# Patient Record
Sex: Female | Born: 1935 | Race: White | Hispanic: No | Marital: Single | Smoking: Never smoker
Health system: Southern US, Community
[De-identification: ages and names within clinical notes are randomized; demographics above are authoritative.]

## PROBLEM LIST (undated history)

## (undated) DIAGNOSIS — E039 Hypothyroidism, unspecified: Secondary | ICD-10-CM

## (undated) DIAGNOSIS — F0390 Unspecified dementia without behavioral disturbance: Secondary | ICD-10-CM

---

## 2011-05-15 ENCOUNTER — Emergency Department: Payer: Self-pay | Admitting: Emergency Medicine

## 2011-05-15 LAB — CBC
HCT: 38.6 % (ref 35.0–47.0)
HGB: 12.8 g/dL (ref 12.0–16.0)
MCH: 31 pg (ref 26.0–34.0)
MCHC: 33.2 g/dL (ref 32.0–36.0)
MCV: 94 fL (ref 80–100)
Platelet: 267 10*3/uL (ref 150–440)
RBC: 4.13 10*6/uL (ref 3.80–5.20)
RDW: 13.7 % (ref 11.5–14.5)
WBC: 14.1 10*3/uL — ABNORMAL HIGH (ref 3.6–11.0)

## 2011-05-15 LAB — COMPREHENSIVE METABOLIC PANEL
Albumin: 4.4 g/dL (ref 3.4–5.0)
Chloride: 105 mmol/L (ref 98–107)
Potassium: 4.7 mmol/L (ref 3.5–5.1)
SGOT(AST): 35 U/L (ref 15–37)
SGPT (ALT): 27 U/L
Total Protein: 8.4 g/dL — ABNORMAL HIGH (ref 6.4–8.2)

## 2011-08-10 ENCOUNTER — Emergency Department: Payer: Self-pay | Admitting: Emergency Medicine

## 2012-07-04 ENCOUNTER — Emergency Department: Payer: Self-pay | Admitting: Emergency Medicine

## 2012-07-04 LAB — COMPREHENSIVE METABOLIC PANEL
Albumin: 3.6 g/dL (ref 3.4–5.0)
BUN: 17 mg/dL (ref 7–18)
Bilirubin,Total: 0.2 mg/dL (ref 0.2–1.0)
Chloride: 108 mmol/L — ABNORMAL HIGH (ref 98–107)
Co2: 28 mmol/L (ref 21–32)
Creatinine: 0.9 mg/dL (ref 0.60–1.30)
EGFR (African American): >60
EGFR (Non-African Amer.): 56 — ABNORMAL LOW
Glucose: 95 mg/dL (ref 65–99)
Osmolality: 286 (ref 275–301)
SGOT(AST): 29 U/L (ref 15–37)
Sodium: 143 mmol/L (ref 136–145)

## 2012-07-04 LAB — CBC
HCT: 38.8 % (ref 35.0–47.0)
Platelet: 234 10*3/uL (ref 150–440)
RBC: 4.21 10*6/uL (ref 3.80–5.20)
RDW: 12.6 % (ref 11.5–14.5)

## 2012-07-04 LAB — URINALYSIS, COMPLETE
Glucose,UR: NEGATIVE mg/dL (ref 0–75)
Ketone: NEGATIVE
Nitrite: NEGATIVE
RBC,UR: 3 /HPF (ref 0–5)
Specific Gravity: 1.009 (ref 1.003–1.030)
Squamous Epithelial: 2

## 2012-07-04 LAB — TROPONIN I: Troponin-I: <0.02 ng/mL

## 2012-07-05 ENCOUNTER — Emergency Department: Payer: Self-pay | Admitting: Unknown Physician Specialty

## 2012-07-09 LAB — CULTURE, BLOOD (SINGLE)

## 2012-09-14 LAB — TROPONIN I: Troponin-I: <0.02 ng/mL

## 2012-09-14 LAB — CBC
HCT: 36.1 % (ref 35.0–47.0)
HGB: 12.3 g/dL (ref 12.0–16.0)
MCH: 31.1 pg (ref 26.0–34.0)
Platelet: 254 10*3/uL (ref 150–440)
RBC: 3.94 10*6/uL (ref 3.80–5.20)
WBC: 11.9 10*3/uL — ABNORMAL HIGH (ref 3.6–11.0)

## 2012-09-14 LAB — URINALYSIS, COMPLETE
Bacteria: NONE SEEN
Bilirubin,UR: NEGATIVE
Blood: NEGATIVE
Glucose,UR: NEGATIVE mg/dL (ref 0–75)
Ketone: NEGATIVE
Leukocyte Esterase: NEGATIVE
Nitrite: NEGATIVE
Ph: 7 (ref 4.5–8.0)
Protein: NEGATIVE
RBC,UR: 3 /HPF (ref 0–5)
Specific Gravity: 1.017 (ref 1.003–1.030)
Squamous Epithelial: NONE SEEN
WBC UR: 1 /HPF (ref 0–5)

## 2012-09-14 LAB — BASIC METABOLIC PANEL
BUN: 18 mg/dL (ref 7–18)
Calcium, Total: 9.2 mg/dL (ref 8.5–10.1)
Chloride: 105 mmol/L (ref 98–107)
Co2: 30 mmol/L (ref 21–32)
Creatinine: 1.04 mg/dL (ref 0.60–1.30)
Glucose: 127 mg/dL — ABNORMAL HIGH (ref 65–99)
Potassium: 3.8 mmol/L (ref 3.5–5.1)

## 2012-09-14 LAB — CK TOTAL AND CKMB (NOT AT ARMC): CK, Total: 81 U/L (ref 21–215)

## 2012-09-15 ENCOUNTER — Inpatient Hospital Stay: Payer: Self-pay | Admitting: Internal Medicine

## 2012-09-15 LAB — BASIC METABOLIC PANEL
Anion Gap: 5 — ABNORMAL LOW (ref 7–16)
BUN: 15 mg/dL (ref 7–18)
Calcium, Total: 8.7 mg/dL (ref 8.5–10.1)
Co2: 28 mmol/L (ref 21–32)
Creatinine: 0.94 mg/dL (ref 0.60–1.30)
EGFR (African American): >60
Glucose: 88 mg/dL (ref 65–99)
Osmolality: 280 (ref 275–301)
Potassium: 3.9 mmol/L (ref 3.5–5.1)
Sodium: 140 mmol/L (ref 136–145)

## 2012-09-15 LAB — CBC WITH DIFFERENTIAL/PLATELET
Basophil #: 0 10*3/uL (ref 0.0–0.1)
Basophil %: 0.5 %
Eosinophil #: 0.2 10*3/uL (ref 0.0–0.7)
HGB: 11.6 g/dL — ABNORMAL LOW (ref 12.0–16.0)
Lymphocyte %: 21.4 %
MCH: 31.5 pg (ref 26.0–34.0)
MCHC: 34.2 g/dL (ref 32.0–36.0)
MCV: 92 fL (ref 80–100)
Monocyte #: 0.9 x10 3/mm (ref 0.2–0.9)
Neutrophil #: 5.2 10*3/uL (ref 1.4–6.5)
Neutrophil %: 64.8 %
RDW: 13.2 % (ref 11.5–14.5)
WBC: 8 10*3/uL (ref 3.6–11.0)

## 2012-09-15 LAB — TROPONIN I
Troponin-I: <0.02 ng/mL
Troponin-I: <0.02 ng/mL

## 2012-09-19 LAB — CULTURE, BLOOD (SINGLE)

## 2012-12-07 ENCOUNTER — Emergency Department: Payer: Self-pay | Admitting: Emergency Medicine

## 2012-12-07 LAB — URINALYSIS, COMPLETE
Ketone: NEGATIVE
Nitrite: NEGATIVE
Ph: 5 (ref 4.5–8.0)
Protein: NEGATIVE
RBC,UR: 1 /HPF (ref 0–5)
Specific Gravity: 1.02 (ref 1.003–1.030)

## 2012-12-07 LAB — CBC WITH DIFFERENTIAL/PLATELET
Basophil #: 0.1 10*3/uL (ref 0.0–0.1)
HCT: 38.6 % (ref 35.0–47.0)
HGB: 13.2 g/dL (ref 12.0–16.0)
Lymphocyte #: 1.5 10*3/uL (ref 1.0–3.6)
MCH: 31.2 pg (ref 26.0–34.0)
MCHC: 34.2 g/dL (ref 32.0–36.0)
Platelet: 221 10*3/uL (ref 150–440)
RBC: 4.23 10*6/uL (ref 3.80–5.20)
WBC: 10.1 10*3/uL (ref 3.6–11.0)

## 2012-12-07 LAB — BASIC METABOLIC PANEL
Anion Gap: 6 — ABNORMAL LOW (ref 7–16)
BUN: 20 mg/dL — ABNORMAL HIGH (ref 7–18)
Co2: 26 mmol/L (ref 21–32)
EGFR (African American): 51 — ABNORMAL LOW
EGFR (Non-African Amer.): 44 — ABNORMAL LOW

## 2013-01-20 ENCOUNTER — Emergency Department: Payer: Self-pay | Admitting: Emergency Medicine

## 2013-01-20 LAB — URINALYSIS, COMPLETE
Bacteria: NONE SEEN
Blood: NEGATIVE
Glucose,UR: NEGATIVE mg/dL (ref 0–75)
Hyaline Cast: 2
Specific Gravity: 1.02 (ref 1.003–1.030)
Squamous Epithelial: 1
WBC UR: 1 /HPF (ref 0–5)

## 2013-10-07 ENCOUNTER — Emergency Department: Payer: Self-pay | Admitting: Emergency Medicine

## 2013-10-07 LAB — CBC
HCT: 39.4 % (ref 35.0–47.0)
HGB: 12.7 g/dL (ref 12.0–16.0)
MCH: 30.7 pg (ref 26.0–34.0)
MCHC: 32.3 g/dL (ref 32.0–36.0)
MCV: 95 fL (ref 80–100)
Platelet: 302 10*3/uL (ref 150–440)
RBC: 4.14 10*6/uL (ref 3.80–5.20)
RDW: 12.9 % (ref 11.5–14.5)
WBC: 9.2 10*3/uL (ref 3.6–11.0)

## 2013-10-07 LAB — COMPREHENSIVE METABOLIC PANEL
ALK PHOS: 117 U/L — AB
ANION GAP: 8 (ref 7–16)
AST: 30 U/L (ref 15–37)
Albumin: 3.6 g/dL (ref 3.4–5.0)
BUN: 25 mg/dL — AB (ref 7–18)
Bilirubin,Total: 0.2 mg/dL (ref 0.2–1.0)
CO2: 26 mmol/L (ref 21–32)
Calcium, Total: 9 mg/dL (ref 8.5–10.1)
Chloride: 108 mmol/L — ABNORMAL HIGH (ref 98–107)
Creatinine: 0.87 mg/dL (ref 0.60–1.30)
EGFR (Non-African Amer.): 58 — ABNORMAL LOW
GLUCOSE: 100 mg/dL — AB (ref 65–99)
Osmolality: 288 (ref 275–301)
Potassium: 4 mmol/L (ref 3.5–5.1)
SGPT (ALT): 17 U/L
SODIUM: 142 mmol/L (ref 136–145)
Total Protein: 7.9 g/dL (ref 6.4–8.2)

## 2013-10-07 LAB — URINALYSIS, COMPLETE
Bacteria: NONE SEEN
Bilirubin,UR: NEGATIVE
Blood: NEGATIVE
Glucose,UR: NEGATIVE mg/dL (ref 0–75)
KETONE: NEGATIVE
LEUKOCYTE ESTERASE: NEGATIVE
NITRITE: NEGATIVE
Ph: 5 (ref 4.5–8.0)
Protein: NEGATIVE
RBC,UR: 2 /HPF (ref 0–5)
SPECIFIC GRAVITY: 1.021 (ref 1.003–1.030)
Squamous Epithelial: 1

## 2013-10-07 LAB — TROPONIN I

## 2013-11-09 ENCOUNTER — Emergency Department: Payer: Self-pay | Admitting: Emergency Medicine

## 2013-11-21 ENCOUNTER — Emergency Department: Payer: Self-pay | Admitting: Emergency Medicine

## 2014-04-15 ENCOUNTER — Emergency Department: Payer: Self-pay | Admitting: Emergency Medicine

## 2014-05-19 ENCOUNTER — Ambulatory Visit
Admit: 2014-05-19 | Disposition: A | Discharge status: Home / Self Care | Payer: Self-pay | Attending: Internal Medicine | Admitting: Internal Medicine

## 2014-06-09 NOTE — H&P (Signed)
Powers NAME:  Amber Powers, Amber Powers MR#:  [id number] DATE OF BIRTH:  17-Dec-1919  DATE OF ADMISSION:  09/14/2012  PRIMARY CARE PHYSICIAN: Dr. Dorothey Baseman.   REFERRING PHYSICIAN: Simonne Maffucci, PA-C.   CHIEF COMPLAINT: Altered mental status, cough.   HISTORY OF PRESENT ILLNESS: Amber Powers is a 81 year old white female with history of hypertension and hyperlipidemia, who lives in assisted living facility, and advanced dementia and hypothyroidism, is brought to [facility] Emergency Department for altered mental status and cough. Per daughter, who is at bedside, states that this evening Amber Powers also complained of some chest pain. Having cough. It is a wet cough. Did not have any fever. Concerning this, Amber Powers was sent to [facility]. Initially, Amber Powers was hypoxic with oxygen saturations of 88%. Amber Powers was placed on 2 liters of oxygen, with improvement of Amber oxygen saturations to 96%. Chest x-ray shows patchy infiltrates. Amber Powers is also quite somnolent. Unable to obtain any history from Amber Powers. Responds slightly with painful stimuli. Per daughter, Amber Powers usually walks with Amber help of a cane, eats independently but slowly, recognizes very limited family members who visit her frequently. Amber Powers received Levaquin in [facility] Emergency Department.   PAST MEDICAL HISTORY:  1. Knee pain.  2. Weight loss.  3. Osteopenia.  4. Hypothyroidism.  5. Hyperlipidemia.  6. Hypertension.  7. Dementia.   ALLERGIES: No known drug allergies.   HOME MEDICATIONS:  1. Levothyroxine 50 mcg once a day.  2. Doxepin 10 mg 2 capsules once a day.  3. Docusate sodium 100 mg 2 times a day.  4. Alprazolam 0.25 mg every 6 hours as needed.  5.  0.25 mg every 12 hours.  6. Vicodin 1 tablet 3 times a day.   SOCIAL HISTORY: No history of smoking, drinking alcohol or using illicit drugs. Lives in an assisted living facility.   FAMILY HISTORY: Noncontributory; however, could not be  obtained from Amber Powers.   REVIEW OF SYSTEMS: Could not be obtained secondary to altered mental status as well as dementia.   PHYSICAL EXAMINATION:  GENERAL: This is a well-built, well-nourished, age-appropriate female lying down in Amber bed, not in distress.  VITAL SIGNS: Temperature 98.2, pulse 80, blood pressure 119/53, respiratory rate of 16, oxygen saturations 98% on 2 liters of oxygen.  HEENT: Head normocephalic, atraumatic. There is no scleral icterus. Conjunctivae normal. Pupils equal and react to light. Mucous membranes moist. Could not examine Amber oropharynx.  NECK: Supple. No lymphadenopathy. No JVD. No carotid bruit. No thyromegaly.  CHEST: Has no focal tenderness. Bilateral rhonchi are heard.  HEART: S1, S2, regular, tachycardia. No pedal edema. Pulses 2+.  ABDOMEN: Bowel sounds present. Soft, nontender, nondistended. No hepatosplenomegaly.  SKIN: No rash or lesions.  MUSCULOSKELETAL: Good range of motion in all of Amber extremities.  NEUROLOGIC: Amber Powers is not oriented to place, person and time. Quite somnolent. Could not examine Amber motor and sensory.   LABS: CBC: WBC of 11.9, hemoglobin 12.3, platelet count of 254. BMP is completely within normal limits.   Troponin less than 0.02, CK 81, CK-MB of 0.5.   TSH 1.97.   UA negative for nitrites and leukocyte esterase.   CT HEAD WITHOUT CONTRAST: Chronic involutional changes, without evidence of acute abnormalities.   CT OF Amber CHEST: No evidence of PE. Moderate hiatal hernia. Interstitial changes within Amber lungs which may represent a component of pulmonary edema or atelectasis.   ASSESSMENT AND PLAN: Amber Powers is a 81 year old female  brought to [facility] for altered mental status and is found to have pneumonia.  1. Altered mental status: This seems to be a combination from Amber medication with alprazolam and Vicodin as well as pneumonia. Admit Amber Powers to Amber monitored bed concerning Amber Powers  complained of chest pain. Hold all sedating medications. Treat Amber underlying pneumonia.  2. Pneumonia: Aspiration versus community-acquired pneumonia. Will treat it with Rocephin and Zithromax anyway. Keep Amber Powers n.p.o. Per daughter, Amber Powers usually does not have any cough with swallowing.  3. Hypertension: Currently well controlled. Will hold all medications for now.  4. Debility: Will involve physical therapy, occupational therapy.  5. Keep Amber Powers on deep vein thrombosis prophylaxis with Lovenox.   TIME SPENT: 45 minutes.   ____________________________ Amber GriffinsPadmaja Calani Gick, MD pv:gb D: 09/14/2012 23:58:55 ET T: 09/15/2012 00:11:54 ET JOB#: [id number]  cc: Amber GriffinsPadmaja Mata Rowen, MD, <Dictator> Teena Iraniavid M. Terance HartBronstein, MD Amber GriffinsPADMAJA Kaelum Kissick MD ELECTRONICALLY SIGNED 10/20/2012 21:45

## 2014-06-09 NOTE — Discharge Summary (Signed)
PATIENT NAME:  [name], Emrey C MR#:  [id number] DATE OF BIRTH:  July 23, 1919  DATE OF ADMISSION:  09/15/2012 DATE OF DISCHARGE:  09/16/2012  DISCHARGE DIAGNOSES: 1.  Altered mental status, pneumonia, possible aspiration.  2.  Hypertension.  3.  Hyponatremia.  4.  Dementia.   CONDITION ON DISCHARGE:  Stable.   CODE STATUS:  FULL CODE.  MEDICATIONS ON DISCHARGE:   1.  Levothyroxine 50 mcg once a day.  2.  Vitamin B12 at 1000 mcg once a day. 3.  Vitamin D3 at 1000 international units once a day.  4.  Lovastatin 40 mg once a day.  5.  Mirtazapine 15 mg once a day.  6.  Docusate 100 mg 2 times a day.  7.  Alprazolam 0.25 mg oral tablet every 6 hours as needed for anxiety.  8.  Amlodipine 5 mg once a day.  9.  Ceftin 500 mg oral tablet 2 times a day for 4 days.   DIET:  The supplement Ensure.  Diet consistency, mechanical soft, TIMEFRAME TO FOLLOWUP:  Within 1 to 2 weeks, regular appointment with PMD.   HISTORY OF PRESENT ILLNESS:  A 81 year old female with history of hypertension, hyperlipidemia, lives at assisted-living facility and has advanced dementia and hypothyroidism, brought to Emergency Room for altered mental status.  As per daughter, she was complaining of some chest pain and was having cough, did not have any fever, and so she was brought to the Emergency Room. Oxygen saturation was 88%.  She was placed on 2 liters of oxygen supplementation, and oxygen saturation came to 96.  Chest x-ray showed patchy infiltrate and so she was started on treatment of pneumonia.  The patient was also taking alprazolam and Vicodin; we stopped that.  Possible altered mental status was due to combination of pneumonia and this medication.  The patient had significant improvement in her mental status the next day and as per daughter she was in her baseline.  Antibiotics Rocephin and Zithromax were started and continued.  She was initially kept nothing by mouth for possibility of aspiration pneumonia and  speech and swallow evaluation was done.  They suggested dietary recommendation and we can discharge the patient having that type of  instruction to be followed at assisted-living facility. 2.  Hypertension:  Started on amlodipine.  3.  Hyperlipidemia:  Continued on lovastatin.  4.  Hypothyroidism:  Continued levothyroxine.   IMPORTANT LABORATORY RESULTS:  White cell count on presentation 11.9.  Hemoglobin was 12.3.  BUN was 18, creatinine was 1.04, potassium was 3.8.  Chloride was 105.  TSH was 1.97.  Chest x-ray showed bony degenerative changes, linear atelectasis both lung bases, small to moderate-sized hiatal hernia, atherosclerotic disease.  Urinalysis was negative.  CT of the chest for pulmonary embolism; no CT evidence of pulmonary embolism, moderate-sized hiatal hernia and interstitial changes within the lung which may be present, component of pulmonary edema, mild atelectasis versus mild infiltrate.  Blood cultures negative.   Total time spent in this discharge 45 minutes.    ____________________________ Hope PigeonVaibhavkumar G. Elisabeth PigeonVachhani, MD vgv:ea D: 09/19/2012 23:06:52 ET T: 09/20/2012 00:00:18 ET JOB#: [id number]  cc: Hope PigeonVaibhavkumar G. Elisabeth PigeonVachhani, MD, <Dictator> Altamese DillingVAIBHAVKUMAR Melford Tullier MD ELECTRONICALLY SIGNED 09/30/2012 1:31

## 2014-06-15 ENCOUNTER — Inpatient Hospital Stay
Admission: AD | Admit: 2014-06-15 | Discharge: 2014-06-18 | DRG: 316 | Disposition: A | Discharge status: Nursing Home (Includes ICF) | Payer: Medicare Other | Attending: Internal Medicine | Admitting: Internal Medicine

## 2014-06-15 DIAGNOSIS — M1990 Unspecified osteoarthritis, unspecified site: Secondary | ICD-10-CM | POA: Diagnosis present

## 2014-06-15 DIAGNOSIS — R4182 Altered mental status, unspecified: Secondary | ICD-10-CM | POA: Diagnosis not present

## 2014-06-15 DIAGNOSIS — F0390 Unspecified dementia without behavioral disturbance: Secondary | ICD-10-CM | POA: Diagnosis present

## 2014-06-15 DIAGNOSIS — Z79899 Other long term (current) drug therapy: Secondary | ICD-10-CM

## 2014-06-15 DIAGNOSIS — I10 Essential (primary) hypertension: Secondary | ICD-10-CM | POA: Diagnosis present

## 2014-06-15 DIAGNOSIS — R627 Adult failure to thrive: Secondary | ICD-10-CM | POA: Diagnosis present

## 2014-06-15 DIAGNOSIS — D72829 Elevated white blood cell count, unspecified: Secondary | ICD-10-CM | POA: Diagnosis present

## 2014-06-15 DIAGNOSIS — Z9181 History of falling: Secondary | ICD-10-CM

## 2014-06-15 DIAGNOSIS — I959 Hypotension, unspecified: Principal | ICD-10-CM | POA: Diagnosis present

## 2014-06-15 DIAGNOSIS — E785 Hyperlipidemia, unspecified: Secondary | ICD-10-CM | POA: Diagnosis present

## 2014-06-15 DIAGNOSIS — E039 Hypothyroidism, unspecified: Secondary | ICD-10-CM | POA: Diagnosis present

## 2014-06-15 DIAGNOSIS — Z79891 Long term (current) use of opiate analgesic: Secondary | ICD-10-CM | POA: Diagnosis not present

## 2014-06-15 DIAGNOSIS — Z791 Long term (current) use of non-steroidal anti-inflammatories (NSAID): Secondary | ICD-10-CM

## 2014-06-15 LAB — COMPREHENSIVE METABOLIC PANEL
ALBUMIN: 4.1 g/dL
ALK PHOS: 89 U/L
ALT: 13 U/L — AB
AST: 33 U/L
Anion Gap: 11 (ref 7–16)
BUN: 20 mg/dL
Bilirubin,Total: 0.3 mg/dL
CHLORIDE: 106 mmol/L
Calcium, Total: 9.3 mg/dL
Co2: 22 mmol/L
Creatinine: 1.32 mg/dL — ABNORMAL HIGH
EGFR (African American): 40 — ABNORMAL LOW
EGFR (Non-African Amer.): 35 — ABNORMAL LOW
GLUCOSE: 180 mg/dL — AB
Potassium: 3.7 mmol/L
Sodium: 139 mmol/L
TOTAL PROTEIN: 7.4 g/dL

## 2014-06-15 LAB — CBC
HCT: 40.9 % (ref 35.0–47.0)
HGB: 13.1 g/dL (ref 12.0–16.0)
MCH: 30.3 pg (ref 26.0–34.0)
MCHC: 32.2 g/dL (ref 32.0–36.0)
MCV: 94 fL (ref 80–100)
Platelet: 285 10*3/uL (ref 150–440)
RBC: 4.33 10*6/uL (ref 3.80–5.20)
RDW: 13.3 % (ref 11.5–14.5)
WBC: 13 10*3/uL — AB (ref 3.6–11.0)

## 2014-06-15 LAB — CLOSTRIDIUM DIFFICILE(ARMC)

## 2014-06-15 LAB — APTT: ACTIVATED PTT: 28.6 s (ref 23.6–35.9)

## 2014-06-15 LAB — CK TOTAL AND CKMB (NOT AT ARMC)
CK, Total: 44 U/L
CK-MB: 1.9 ng/mL

## 2014-06-15 LAB — PROTIME-INR
INR: 1
Prothrombin Time: 13.4 secs

## 2014-06-15 LAB — TROPONIN I: Troponin-I: <0.03 ng/mL

## 2014-06-16 LAB — CULTURE, BLOOD (SINGLE)

## 2014-06-17 MED ORDER — SODIUM CHLORIDE 0.9 % IJ SOLN: 3.0000 mL | INTRAMUSCULAR | Status: DC | PRN

## 2014-06-17 MED ORDER — ALPRAZOLAM 0.25 MG PO TABS: 0.2500 mg | ORAL_TABLET | Freq: Every day | ORAL | Status: DC | PRN

## 2014-06-17 MED ORDER — GLYCOPYRROLATE 0.2 MG/ML IJ SOLN: 0.2000 mg | INTRAMUSCULAR | Status: DC | PRN

## 2014-06-17 MED ORDER — ONDANSETRON HCL 4 MG/2ML IJ SOLN: 4.0000 mg | INTRAMUSCULAR | Status: DC | PRN

## 2014-06-17 MED ORDER — HYDROCODONE-ACETAMINOPHEN 5-325 MG PO TABS
1.0000 | ORAL_TABLET | Freq: Three times a day (TID) | ORAL | Status: DC
  Administered 2014-06-18: 1 via ORAL
  Filled 2014-06-17: qty 1

## 2014-06-17 MED ORDER — HALOPERIDOL LACTATE 5 MG/ML IJ SOLN: 1.0000 mg | INTRAMUSCULAR | Status: DC | PRN

## 2014-06-17 MED ORDER — LORAZEPAM 2 MG/ML IJ SOLN: 0.5000 mg | INTRAMUSCULAR | Status: DC | PRN

## 2014-06-18 DIAGNOSIS — R4182 Altered mental status, unspecified: Secondary | ICD-10-CM | POA: Diagnosis present

## 2014-06-18 NOTE — Progress Notes (Signed)
Anticipate d/c back to [facility]. Awaiting Laverne from [facility] to complete assessment this morning. CSW will follow. Dellie BurnsJosie Shamika Pedregon, MSW, LCSW [phone] (weekend coverage)

## 2014-06-18 NOTE — H&P (Addendum)
PATIENT NAME:  Amber Powers, Amber Powers MR#:  [id number] DATE OF BIRTH:  [birth year]  DATE OF ADMISSION:  06/15/2014  PRIMARY CARE PHYSICIAN: [facility]   CHIEF COMPLAINT: Altered mental status.   HISTORY OF PRESENT ILLNESS: [name]  is a 81 year old, elderly, Caucasian female with past medical history significant for history of severe dementia, [facility], history of hypertension, hypothyroidism, who was brought in secondary to altered mental status today. Daughter provides most of the history the patient is lethargic currently. According to daughter, the patient needs help with most of her activities of daily living, but she is still able to walk without any assistance. Needs some help during feeding and is drinking from a Sippy cup at this time. Not able to make her needs known usually and her speech is not comprehensible sometimes and she only recognizes her younger daughter so far. She was noticed to be very confused, difficult to be aroused this morning and was sent over to the hospital. The patient was noted to be persistently hypotensive here in the emergency room, requiring IV fluid boluses. Her labs do indicate that she does have an elevated white count. Prior to putting a central line in, they have consulted palliative care who talked to the family and they want her to be comfort care at this time.   PAST MEDICAL HISTORY: 1. Arthritis.  2. Hypothyroidism.  3. Hyperlipidemia.  4. Hypertension.  5. Dementia.   PAST SURGICAL HISTORY: No recent surgeries.   ALLERGIES: No known drug allergies.   CURRENT HOME MEDICATIONS: Include: 1. Vicodin 5/325 mg one tablet 3 times a day for 12 years.  2. Xanax 0.25 mg p.o. as needed for anxiety daily.  3. Norvasc 5 mg p.o. daily.  4. Biotin mouthwash 30 mL once a day as needed for dry mouth.  5. Bisacodyl suppository once a day at bedtime as needed for constipation.  6. Colace 10 mL liquid  once a day in the morning.  7. Ergocalciferol 8000 international units once a day. 8. Geri-Lanta/Mylanta 30 mL every 6 hours p.r.n. for heartburn.  9. Synthroid 25 mcg p.o. daily.  10. Loperamide 2 mg capsule every 3 hours p.r.n. for diarrhea.  11. Tylenol 500 mg every 4 hours p.r.n. for pain or fever.  12. Milk of magnesia 30 mL daily p.r.n. for constipation.  13. Remeron 7.5 mg p.o. at bedtime.  14. MiraLax powder p.o. b.i.d.  15. MiraLax powder daily p.r.n. for constipation.  16. Preparation-H cream 0.25% to 1% rectal cream twice a day as needed for hemorrhoids.  17. Q-Tussin 10 mL every 6 hours as needed for cough.  18. Risamine topical ointment every 4 hours as needed for erythema.  19. Triple antibiotic 4 times a day as needed for skin tears and abrasions.  20. Vitamin B12 - 1000 mcg p.o. daily.   SOCIAL HISTORY: Has been a resident of [facility]. Ambulates without any cane or walker. No smoking or alcohol use.   FAMILY HISTORY: Noncontributory.  REVIEW OF SYSTEMS: Difficult to be obtained secondary to the patient's lethargy.   PHYSICAL EXAMINATION: VITAL SIGNS: Temperature afebrile, pulse 102, respirations 17, blood pressure 86/49, pulse oximetry 95% on 2 liters nasal cannula.  GENERAL: Elderly female, very restless in bed. Well-built and well-nourished.  HEENT: Normocephalic, atraumatic. Pupils postsurgical, equal, round, reacting to light. Anicteric sclerae. Very dry mucous membranes in the oropharynx, but no erythema, mass or exudates.  NECK: Supple. No  thyromegaly, JVD or carotid bruits. No lymphadenopathy.  LUNGS: Moving air bilaterally. Decreased bibasilar breath sounds. No rhonchi, wheezes or crackles noted.  CARDIOVASCULAR: S1, S2. Rapid rate and regular rhythm. A 3/6 systolic murmur present. No rubs or gallops.  ABDOMEN: Soft, obese. Mild discomfort on palpation, however, difficult to assess secondary to her lethargy. Normal bowel sounds are  present. No hepatosplenomegaly.  EXTREMITIES: No pedal edema. Good dorsalis pedis pulses palpable bilaterally.  SKIN: No acne, rash or lesions.  LYMPHATICS: No cervical lymphadenopathy.  NEUROLOGIC: The patient is obtunded at this time, quite somnolent. Cannot do a complete neurologic exam. She does have occasional myoclonic jerks and moving restlessly in bed.    LABORATORY DATA: WBC 13.0, hemoglobin 13.1, hematocrit 40.9, platelet count 285,000. Sodium 139, potassium 3.7, chloride 106, bicarbonate 22, BUN 20, creatinine 1.3, glucose 18, and calcium of 9.3. ALT is 13, AST is 33, alkaline phosphatase 89, total bilirubin 0.3, albumin of 4.1. INR is 1.0. CK 44, CK-MB 1.9. Troponin is less than 0.03. Urinalysis has not been obtained as of yet.  DIAGNOSTIC DATA: Chest x-ray showing right base scarring, otherwise clear lung fields. No edema, consolidation. Heart size and pulmonary vascularity are normal. CT of the head showing atrophy, chronic ischemic white matter disease. No acute intracranial abnormalities.   ASSESSMENT AND PLAN: A 81 year old lady with severe dementia, hypertension, hypothyroidism, arthritis on chronic pain medication, presents to the hospital with what seems like a sepsis picture, unknown source yet. Urine cannot be obtained. The patient has been persistently hypotensive here in the hospital in spite of fluids. Palliative care has been consulted and after prolonged discussion with daughter at bedside, they have decided to do comfort care for the patient instead of going with essential line and aggressive care at this time. Daughter was not comfortable with the idea of starting morphine p.r.n. for the patient, though the patient has been chronically taking 5/325 of Vicodin for several years now. The patient appears very uncomfortable and restless in bed. Ativan and p.r.n. morphine has been ordered. IV. As mentioned above, daughter was not comfortable with IV morphine. Discussed at length  about low-dose fentanyl patch and she refused fentanyl patch, so she finally agreed for Roxanol p.r.n. for comfort, which I ordered at this time. The family still hopes, and they want to see if she will turn around in 24 hours; however, as per palliative care's discussion, she is comfort care only at this time. I explained to the family that either she gets aggressive care or she will be comfort care and they finally decided that she would be comfort care at this time. She is on comfort medications and is being admitted to floor bed.   CODE STATUS: Do not resuscitate.   TIME SPENT ON ADMISSION: 45 minutes.     ____________________________ Enid Baas, MD rk:TT D: 06/15/2014 17:38:04 ET T: 06/15/2014 18:07:02 ET JOB#: [id number]  cc: Enid Baas, MD, <Dictator> Enid Baas MD ELECTRONICALLY SIGNED 06/16/2014 18:00

## 2014-06-18 NOTE — Discharge Summary (Signed)
PATIENT NAME:  Amber Powers, Amber Powers MR#:  [id number] DATE OF BIRTH:  January 08, 1920  DATE OF ADMISSION:  06/15/2014 DATE OF DISCHARGE:  06/18/2014  ADMISSION DIAGNOSIS:   Altered mental status.   DISCHARGE DIAGNOSES: 1. Altered mental status.  2. Dementia.  3. History of hyperlipidemia.   PHYSICAL EXAMINATION AT DISCHARGE: VITAL SIGNS: The patient's temperature 98.4, pulse 65 respirations 20, blood pressure 121/47 and 93% on 2 liters.  GENERAL: The patient is alert, oriented to name, not place or time.  CARDIOVASCULAR: Regular rate and rhythm. No murmur, gallops, or rubs.  LUNGS: Clear to auscultation without crackle, rales, rhonchi, or wheezing.  ABDOMEN: Bowel sounds positive. Nontender, nondistended.   HOSPITAL COURSE: This is a 81 year old female who presented on April 28 with altered mental status. She was initially placed on comfort care due to her failure to thrive; however, on 06/16/2014, she actually did a 180 reversal and she was talking and had much improved. Family had decided to discontinue comfort care. She was continued on her outpatient medications and plan currently is to send the patient to skilled nursing facility.   DISCHARGE MEDICATIONS: 1. Vitamin B12 at 1000 mcg daily.  2. Xanax 0.25 mg as needed p.r.n.  3. Biotene mouthwash 30 mL p.r.n.  4. Bisac-Evac 10 mg rectal suppository p.r.n. daily.  5. Geri-Lanta 30 mL every 6 hours p.r.n.  6. Loperamide 2 mg q. 3 hours p.r.n.  7. Tylenol 500 mg q. 4 hours p.r.n.  8. Milk of magnesia 30 mL at bedtime p.r.n.  9. Polyethylene glycol 17 gm daily p.r.n.  10. Preparation H b.i.d. p.r.n.  11. Q-Tussin 10 mL q. 6 hours p.r.n.  12. Risamine topical q. 4 hours p.r.n.  13. Norvasc 5 mg daily.  14. Docu 10 mL a.m.  15. Ergocalciferol 0.25 mL in the morning.  16. Synthroid 75 mcg daily.  17. Remeron 7.5 at bedtime.  18. Acetaminophen hydrocodone 5/325 t.i.d.  19. Polyethylene glycol 17 grams b.i.d.   DISCHARGE DIET: Regular  diet.   DISCHARGE ACTIVITY: As tolerated.   DISCHARGE REFERRAL: Physical therapy.   FOLLOWUP:   The patient will need to follow up with [facility].   TIME SPENT: 40 minutes.

## 2014-06-18 NOTE — Progress Notes (Signed)
Pt for d/c back to [facility] today via EMS. Pt's family at bedside and agreeable to d/c. Per RNCM, [facility] CSW signing off. Amber Powers, MSW, LCSW [phone] (weekend coverage)

## 2014-06-18 NOTE — Plan of Care (Signed)
Problem: Discharge Progression Outcomes Goal: Discharge plan in place and appropriate Individualization of care  1) Admitted from ED 2)[facility], hx of dementia, [facility] #)Chronic back, treated with vicodin po at [facility] Goal: Other Discharge Outcomes/Goals Plan of care progress to goal:  Pt remains stable with no complaints, [facility]

## 2014-06-18 NOTE — Progress Notes (Signed)
For prior documentation please see [facility] medical record number [id number]

## 2014-06-18 NOTE — Consult Note (Signed)
   Comments   Pt now awake, talking, appears comfortable. Family at bedside. Pt ate ice cream earlier. Will advance diet. If continues to improve, will return [facility].   Electronic Signatures: Shamera Yarberry, Harriett SineNancy (MD)  (Signed 29-Apr-16 14:21)  Authored: Palliative Care   Last Updated: 29-Apr-16 14:21 by Iniya Matzek, Harriett SineNancy (MD)

## 2014-06-19 NOTE — Discharge Summary (Addendum)
PATIENT NAME:  [name], Nili C MR#:  [id number] DATE OF BIRTH:  Jun 22, 1919  DATE OF ADMISSION:  06/15/2014 DATE OF DISCHARGE:  06/17/2014  ADMISSION DIAGNOSIS:   Altered mental status.   DISCHARGE DIAGNOSES: 1. Altered mental status.  2. Dementia.  3. History of hyperlipidemia.   PHYSICAL EXAMINATION AT DISCHARGE: VITAL SIGNS: The patient's temperature 98.4, pulse 71, respirations 20, blood pressure 134/64, and 100% on 2 liters.  GENERAL: The patient is alert, oriented to name, not place or time.  CARDIOVASCULAR: Regular rate and rhythm. No murmur, gallops, or rubs.  LUNGS: Clear to auscultation without crackle, rales, rhonchi, or wheezing.  ABDOMEN: Bowel sounds positive. Nontender, nondistended.   HOSPITAL COURSE: This is a 81 year old female who presented on April 28 with altered mental status. She was initially placed on comfort care due to her failure to thrive; however, on 06/16/2014, she actually did a 180 reversal and she was talking and had much improved. Family had decided to discontinue comfort care. She was continued on her outpatient medications and plan currently is to send the patient to skilled nursing facility.   DISCHARGE MEDICATIONS: 1. Vitamin B12 at 1000 mcg daily.  2. Xanax 0.25 mg as needed p.r.n.  3. Biotene mouthwash 30 mL p.r.n.  4. Bisac-Evac 10 mg rectal suppository p.r.n. daily.  5. Geri-Lanta 30 mL every 6 hours p.r.n.  6. Loperamide 2 mg q. 3 hours p.r.n.  7. Tylenol 500 mg q. 4 hours p.r.n.  8. Milk of magnesia 30 mL at bedtime p.r.n.  9. Polyethylene glycol 17 gm daily p.r.n.  10. Preparation H b.i.d. p.r.n.  11. Q-Tussin 10 mL q. 6 hours p.r.n.  12. Risamine topical q. 4 hours p.r.n.  13. Norvasc 5 mg daily.  14. Docu 10 mL a.m.  15. Ergocalciferol 0.25 mL in the morning.  16. Synthroid 75 mcg daily.  17. Remeron 7.5 at bedtime.  18. Acetaminophen hydrocodone 5/325 t.i.d.  19. Polyethylene glycol 17 grams b.i.d.   DISCHARGE DIET: Regular  diet.   DISCHARGE ACTIVITY: As tolerated.   DISCHARGE REFERRAL: Physical therapy.   FOLLOWUP:   The patient will need to follow up with [facility].   TIME SPENT: 40 minutes.   ____________________________ Janyth ContesSital P. Juliene PinaMody, MD spm:tr D: 06/17/2014 12:32:00 ET T: 06/17/2014 13:05:58 ET JOB#: [id number]  cc: Francenia Chimenti P. Juliene PinaMody, MD, <Dictator> Janyth ContesSITAL P Ryhanna Dunsmore MD ELECTRONICALLY SIGNED 06/23/2014 13:31

## 2014-06-20 NOTE — Progress Notes (Signed)
Call received from [place] with social work stating patient has not received her wheelchair as ordered.  Call to Will with [facility] who states additional MD documentation from Dr Juliene PinaMody is needed and that is what [facility] is waiting for to deliver the wheelchair. Will is going to follow up with Dr Juliene PinaMody today.  Patient's daughter Dois DavenportSandra [phone]) updated.  Deniz Eskridge, Naaman Plummereri Diane, RN CCM [facility]

## 2015-01-27 ENCOUNTER — Emergency Department
Admission: EM | Admit: 2015-01-27 | Discharge: 2015-01-27 | Disposition: A | Discharge status: Home / Self Care | Payer: Medicare Other | Attending: Emergency Medicine | Admitting: Emergency Medicine

## 2015-01-27 DIAGNOSIS — W228XXA Striking against or struck by other objects, initial encounter: Secondary | ICD-10-CM | POA: Diagnosis not present

## 2015-01-27 DIAGNOSIS — Y9289 Other specified places as the place of occurrence of the external cause: Secondary | ICD-10-CM | POA: Insufficient documentation

## 2015-01-27 DIAGNOSIS — W19XXXA Unspecified fall, initial encounter: Secondary | ICD-10-CM

## 2015-01-27 DIAGNOSIS — Y998 Other external cause status: Secondary | ICD-10-CM | POA: Insufficient documentation

## 2015-01-27 DIAGNOSIS — S0990XA Unspecified injury of head, initial encounter: Secondary | ICD-10-CM | POA: Insufficient documentation

## 2015-01-27 DIAGNOSIS — Y9339 Activity, other involving climbing, rappelling and jumping off: Secondary | ICD-10-CM | POA: Diagnosis not present

## 2015-01-27 DIAGNOSIS — F0390 Unspecified dementia without behavioral disturbance: Secondary | ICD-10-CM | POA: Insufficient documentation

## 2015-01-27 HISTORY — DX: Hypothyroidism, unspecified: E03.9

## 2015-01-27 HISTORY — DX: Unspecified dementia, unspecified severity, without behavioral disturbance, psychotic disturbance, mood disturbance, and anxiety: F03.90

## 2015-01-27 NOTE — ED Notes (Signed)
Pt has been discharged since 1737 - was awaiting an ems ride back to the [facility]. Daughter states she will take her home instead. Blanket placed around pt and taken out to the car by [facility]

## 2015-01-27 NOTE — ED Notes (Signed)
MD at bedside. 

## 2015-01-27 NOTE — ED Provider Notes (Signed)
[facility] Emergency Department Provider Note     Time seen: ----------------------------------------- 4:47 PM on 01/27/2015 -----------------------------------------  L5 caveat: Review of systems and history is limited by severe dementia.  I have reviewed the triage vital signs and the nursing notes.   HISTORY  Chief Complaint Head Injury    HPI Amber Powers is a 81 y.o. female being brought to ER for head injury. Patient has severe dementia, she attempted to jump onto a couch and hit her head. According to reports she is acting at her baseline, no loss of consciousness. Patient is very demented and cannot give review of systems or history.   Past Medical History  Diagnosis Date  . Hypothyroid   . Dementia     Patient Active Problem List   Diagnosis Date Noted  . Altered mental state 06/18/2014    History reviewed. No pertinent past surgical history.  Allergies Review of patient's allergies indicates no known allergies.  Social History Social History  Substance Use Topics  . Smoking status: Unknown If Ever Smoked  . Smokeless tobacco: None  . Alcohol Use: No    Review of Systems Unknown, positive for reported headache injury.  ____________________________________________   PHYSICAL EXAM:  VITAL SIGNS: ED Triage Vitals  Enc Vitals Group     BP 01/27/15 1641 145/76 mmHg     Pulse Rate 01/27/15 1641 102     Resp 01/27/15 1641 20     Temp 01/27/15 1641 98.4 F (36.9 C)     Temp Source 01/27/15 1641 Axillary     SpO2 01/27/15 1641 98 %     Weight 01/27/15 1641 140 lb (63.504 kg)     Height 01/27/15 1641 5' (1.524 m)     Head Cir --      Peak Flow --      Pain Score --      Pain Loc --      Pain Edu? --      Excl. in GC? --     Constitutional: Alert but disoriented. Well appearing and in no distress. Eyes: Conjunctivae are normal. PERRL. Normal extraocular movements. ENT   Head: Normocephalic and  atraumatic.   Nose: No congestion/rhinnorhea.   Mouth/Throat: Mucous membranes are moist.   Neck: No stridor. Cardiovascular: Normal rate, regular rhythm. No murmurs, rubs, or gallops. Respiratory: Normal respiratory effort without tachypnea nor retractions. Breath sounds are clear and equal bilaterally.  Gastrointestinal: Soft and nontender. Normal bowel sounds. Musculoskeletal: Nontender with normal range of motion in all extremities. Neurologic:  Patient with nonsensical speech, moves all of her extremities well. Was able to stand without difficulty. Skin:  Skin is warm, dry and intact. No rash noted. Psychiatric: Mood and affect are normal. ___________________________________________  ED COURSE:  Pertinent labs & imaging results that were available during my care of the patient were reviewed by me and considered in my medical decision making (see chart for details). Patient with minor head injury, there areno physical signs of trauma. I will observe the patient rather than perform CT imaging.  ____________________________________________  FINAL ASSESSMENT AND PLAN  Minor head injury  Plan: Patient with labs and imaging as dictated above. Patient has been observed, she remains at her baseline. She would not be a neurosurgical candidate even if neurosurgical condition was discovered. She is stable for outpatient follow-up.   Emily FilbertWilliams, Keidy Thurgood E, MD   Emily FilbertJonathan E Skyylar Kopf, MD 01/27/15 [phone]

## 2015-01-27 NOTE — Discharge Instructions (Signed)
Fall Prevention in the Home  Falls can cause injuries and can affect people from all age groups. There are many simple things that you can do to make your home safe and to help prevent falls. WHAT CAN I DO ON THE OUTSIDE OF MY HOME?  Regularly repair the edges of walkways and driveways and fix any cracks.  Remove high doorway thresholds.  Trim any shrubbery on the main path into your home.  Use bright outdoor lighting.  Clear walkways of debris and clutter, including tools and rocks.  Regularly check that handrails are securely fastened and in good repair. Both sides of any steps should have handrails.  Install guardrails along the edges of any raised decks or porches.  Have leaves, snow, and ice cleared regularly.  Use sand or salt on walkways during winter months.  In the garage, clean up any spills right away, including grease or oil spills. WHAT CAN I DO IN THE BATHROOM?  Use night lights.  Install grab bars by the toilet and in the tub and shower. Do not use towel bars as grab bars.  Use non-skid mats or decals on the floor of the tub or shower.  If you need to sit down while you are in the shower, use a plastic, non-slip stool..  Keep the floor dry. Immediately clean up any water that spills on the floor.  Remove soap buildup in the tub or shower on a regular basis.  Attach bath mats securely with double-sided non-slip rug tape.  Remove throw rugs and other tripping hazards from the floor. WHAT CAN I DO IN THE BEDROOM?  Use night lights.  Make sure that a bedside light is easy to reach.  Do not use oversized bedding that drapes onto the floor.  Have a firm chair that has side arms to use for getting dressed.  Remove throw rugs and other tripping hazards from the floor. WHAT CAN I DO IN THE KITCHEN?   Clean up any spills right away.  Avoid walking on wet floors.  Place frequently used items in easy-to-reach places.  If you need to reach for something  above you, use a sturdy step stool that has a grab bar.  Keep electrical cables out of the way.  Do not use floor polish or wax that makes floors slippery. If you have to use wax, make sure that it is non-skid floor wax.  Remove throw rugs and other tripping hazards from the floor. WHAT CAN I DO IN THE STAIRWAYS?  Do not leave any items on the stairs.  Make sure that there are handrails on both sides of the stairs. Fix handrails that are broken or loose. Make sure that handrails are as long as the stairways.  Check any carpeting to make sure that it is firmly attached to the stairs. Fix any carpet that is loose or worn.  Avoid having throw rugs at the top or bottom of stairways, or secure the rugs with carpet tape to prevent them from moving.  Make sure that you have a light switch at the top of the stairs and the bottom of the stairs. If you do not have them, have them installed. WHAT ARE SOME OTHER FALL PREVENTION TIPS?  Wear closed-toe shoes that fit well and support your feet. Wear shoes that have rubber soles or low heels.  When you use a stepladder, make sure that it is completely opened and that the sides are firmly locked. Have someone hold the ladder while you   are using it. Do not climb a closed stepladder.  Add color or contrast paint or tape to grab bars and handrails in your home. Place contrasting color strips on the first and last steps.  Use mobility aids as needed, such as canes, walkers, scooters, and crutches.  Turn on lights if it is dark. Replace any light bulbs that burn out.  Set up furniture so that there are clear paths. Keep the furniture in the same spot.  Fix any uneven floor surfaces.  Choose a carpet design that does not hide the edge of steps of a stairway.  Be aware of any and all pets.  Review your medicines with your healthcare provider. Some medicines can cause dizziness or changes in blood pressure, which increase your risk of falling. Talk  with your health care provider about other ways that you can decrease your risk of falls. This may include working with a physical therapist or trainer to improve your strength, balance, and endurance.   This information is not intended to replace advice given to you by your health care provider. Make sure you discuss any questions you have with your health care provider.   Document Released: 01/24/2002 Document Revised: 06/20/2014 Document Reviewed: 03/10/2014 Elsevier Interactive Patient Education 2016 [employer]  

## 2015-01-27 NOTE — ED Notes (Signed)
Per ems pt attempted to jump onto couch and hit head.

## 2015-09-30 ENCOUNTER — Emergency Department: Payer: Medicare Other

## 2015-09-30 ENCOUNTER — Encounter: Payer: Self-pay | Admitting: Emergency Medicine

## 2015-09-30 ENCOUNTER — Emergency Department
Admission: EM | Admit: 2015-09-30 | Discharge: 2015-09-30 | Disposition: A | Discharge status: Home / Self Care | Payer: Medicare Other | Attending: Emergency Medicine | Admitting: Emergency Medicine

## 2015-09-30 DIAGNOSIS — E039 Hypothyroidism, unspecified: Secondary | ICD-10-CM | POA: Insufficient documentation

## 2015-09-30 DIAGNOSIS — Z792 Long term (current) use of antibiotics: Secondary | ICD-10-CM | POA: Diagnosis not present

## 2015-09-30 DIAGNOSIS — Y939 Activity, unspecified: Secondary | ICD-10-CM | POA: Insufficient documentation

## 2015-09-30 DIAGNOSIS — S0990XA Unspecified injury of head, initial encounter: Secondary | ICD-10-CM | POA: Insufficient documentation

## 2015-09-30 DIAGNOSIS — Y999 Unspecified external cause status: Secondary | ICD-10-CM | POA: Insufficient documentation

## 2015-09-30 DIAGNOSIS — Y9289 Other specified places as the place of occurrence of the external cause: Secondary | ICD-10-CM | POA: Insufficient documentation

## 2015-09-30 DIAGNOSIS — Z79899 Other long term (current) drug therapy: Secondary | ICD-10-CM | POA: Insufficient documentation

## 2015-09-30 DIAGNOSIS — W1800XA Striking against unspecified object with subsequent fall, initial encounter: Secondary | ICD-10-CM | POA: Insufficient documentation

## 2015-09-30 NOTE — ED Notes (Signed)
Pt asleep. NAD. Respiration unlabored. Waiting on disposition.

## 2015-09-30 NOTE — ED Notes (Signed)
Report called Emergency planning/management officerrica RN at [facility]. Pt. Waiting on EMS transport.

## 2015-09-30 NOTE — ED Triage Notes (Signed)
Pt to rm 24 via EMS from [facility] for witnessed fall, report she fell in bathroom, hit back of head, no LOC, no hematoma seen.  Pt non-verbal not showing any signs of pain.  Pt nad at this time.

## 2015-09-30 NOTE — ED Notes (Signed)
Called for transport by EMS [phone]

## 2015-09-30 NOTE — ED Notes (Signed)
[facility] states pt. Had fall and hit back of head.  No obvious injury noted on pt. Head.  Pt. Mostly non-verbal, per family.  Pt. Does not appear to be in any pain.

## 2015-09-30 NOTE — ED Notes (Signed)
Pt. Left with [facility] back to [facility].

## 2015-09-30 NOTE — ED Provider Notes (Signed)
[facility] Emergency Department Provider Note  ____________________________________________   First MD Initiated Contact with Patient 09/30/15 [phone]     (approximate)  I have reviewed the triage vital signs and the nursing notes.  History Limited secondary to nonverbal patient with dementia   HISTORY  Chief Complaint Fall    HPI Amber Powers is a 82 y.o. female with history of dementia and nonverbal presents with witnessed fall at [facility] house this morning. Per the patient's daughter patient apparently told backwards striking the posterior portion of her head without loss of consciousness as reported by the staff at [facility].   Past Medical History:  Diagnosis Date  . Dementia   . Hypothyroid     Patient Active Problem List   Diagnosis Date Noted  . Altered mental state 06/18/2014    History reviewed. No pertinent surgical history.  Prior to Admission medications   Medication Sig Start Date End Date Taking? Authorizing Provider  acetaminophen (TYLENOL) 500 MG tablet Take 500 mg by mouth every 4 (four) hours as needed for fever. For Fever up to 101, for headache, for pain    Historical Provider, MD  ALPRAZolam (XANAX) 0.25 MG tablet Take 0.25 mg by mouth daily as needed for anxiety. For anxiety and nervousness.    Historical Provider, MD  alum & mag hydroxide-simeth (MAALOX/MYLANTA) 200-200-20 MG/5ML suspension Take 30 mLs by mouth every 6 (six) hours as needed for indigestion or heartburn.    Historical Provider, MD  amLODipine (NORVASC) 5 MG tablet Take 5 mg by mouth daily.    Historical Provider, MD  antiseptic oral rinse (BIOTENE) LIQD 30 mLs by Mouth Rinse route daily as needed for dry mouth.    Historical Provider, MD  bisacodyl (DULCOLAX) 10 MG suppository Place 10 mg rectally daily as needed for moderate constipation. At bedtime as needed.    Historical Provider, MD  ergocalciferol (DRISDOL) 8000 UNIT/ML drops Take 2,000 Units  by mouth daily. In the morning    Historical Provider, MD  guaifenesin (ROBITUSSIN) 100 MG/5ML syrup Take 200 mg by mouth every 6 (six) hours as needed for cough.    Historical Provider, MD  HYDROcodone-acetaminophen (NORCO/VICODIN) 5-325 MG per tablet Take 1 tablet by mouth 3 (three) times daily.    Historical Provider, MD  levothyroxine (SYNTHROID, LEVOTHROID) 75 MCG tablet Take 75 mcg by mouth daily before breakfast.    Historical Provider, MD  loperamide (IMODIUM) 2 MG capsule Take 2 mg by mouth as needed for diarrhea or loose stools.    Historical Provider, MD  Magnesium Hydroxide (MILK OF MAGNESIA PO) Take by mouth.    Historical Provider, MD  Menthol-Zinc Oxide 0.44-20.625 % OINT Apply topically every 4 (four) hours as needed. For redness    Historical Provider, MD  mirtazapine (REMERON) 7.5 MG tablet Take 7.5 mg by mouth at bedtime.    Historical Provider, MD  Neomycin-Bacitracin-Polymyxin (TRIPLE ANTIBIOTIC) [phone] OINT Apply topically 4 (four) times daily as needed. For skin tear, abrasions, or minor irritations    Historical Provider, MD  NON FORMULARY Take 100 mg by mouth daily. Once a day in the morning.    Historical Provider, MD  Polyethylene Glycol 3350 GRAN 17 g by Does not apply route 2 (two) times daily.    Historical Provider, MD  Pramox-PE-Glycerin-Petrolatum (PREPARATION H RE) Place 1 application rectally 2 (two) times daily as needed.    Historical Provider, MD  vitamin B-12 (CYANOCOBALAMIN) 1000 MCG tablet Take 1,000 mcg by mouth daily.  Historical Provider, MD    Allergies No known drug allergies No family history on file.  Social History Social History  Substance Use Topics  . Smoking status: Unknown If Ever Smoked  . Smokeless tobacco: Never Used  . Alcohol use No    Review of Systems Constitutional: No fever/chills Eyes: No visual changes. ENT: No sore throat. Cardiovascular: Denies chest pain. Respiratory: Denies shortness of  breath. Gastrointestinal: No abdominal pain.  No nausea, no vomiting.  No diarrhea.  No constipation. Genitourinary: Negative for dysuria. Musculoskeletal: Negative for back pain. Skin: Negative for rash. Neurological: Negative for headaches, focal weakness or numbness.  10-point ROS otherwise negative.  ____________________________________________   PHYSICAL EXAM:  VITAL SIGNS: ED Triage Vitals  Enc Vitals Group     BP 09/30/15 0506 (!) 137/51     Pulse Rate 09/30/15 0506 74     Resp 09/30/15 0506 18     Temp 09/30/15 0506 (!) 96.4 F (35.8 C)     Temp Source 09/30/15 0506 Axillary     SpO2 09/30/15 0506 100 %     Weight 09/30/15 0512 143 lb (64.9 kg)     Height 09/30/15 0512 5\' 3"  (1.6 m)     Head Circumference --      Peak Flow --      Pain Score --      Pain Loc --      Pain Edu? --      Excl. in GC? --     Constitutional: Alert . Well appearing and in no acute distress. Eyes: Conjunctivae are normal. PERRL. EOMI. Head: Atraumatic. Ears:  Healthy appearing ear canals and TMs bilaterally Nose: No congestion/rhinnorhea. Mouth/Throat: Mucous membranes are moist.  Oropharynx non-erythematous. Neck: No stridor.  No meningeal signs.  Cardiovascular: Normal rate, regular rhythm. Good peripheral circulation. Grossly normal heart sounds.   Respiratory: Normal respiratory effort.  No retractions. Lungs CTAB. Gastrointestinal: Soft and nontender. No distention.  Musculoskeletal: No lower extremity tenderness nor edema. No gross deformities of extremities. Neurologic:  Normal speech and language. No gross focal neurologic deficits are appreciated.  Skin:  Skin is warm, dry and intact. No rash noted. Psychiatric: Mood and affect are normal. Speech and behavior are normal.   RADIOLOGY I, Crook N Amandy Chubbuck, personally viewed and evaluated these images (plain radiographs) as part of my medical decision making, as well as reviewing the written report by the radiologist.  No  results found.   Procedures    INITIAL IMPRESSION / ASSESSMENT AND PLAN / ED COURSE  Pertinent labs & imaging results that were available during my care of the patient were reviewed by me and considered in my medical decision making (see chart for details).    Clinical Course    ____________________________________________  FINAL CLINICAL IMPRESSION(S) / ED DIAGNOSES  Final diagnoses:  Head injury, initial encounter     MEDICATIONS GIVEN DURING THIS VISIT:  Medications - No data to display   NEW OUTPATIENT MEDICATIONS STARTED DURING THIS VISIT:  New Prescriptions   No medications on file      Note:  This document was prepared using Dragon voice recognition software and may include unintentional dictation errors.    Darci Currentandolph N Mysha Peeler, MD 10/04/15 [phone]

## 2015-09-30 NOTE — ED Provider Notes (Signed)
[facility]  I accepted care from Dr. Manson PasseyBrown ____________________________________________    LABS (pertinent positives/negatives)  Labs Reviewed - No data to display   ____________________________________________    RADIOLOGY All xrays were viewed by me. Imaging interpreted by radiologist.  CT head and c spine:    CLINICAL DATA: Initial evaluation for acute trauma, fall with occipital head injury.  EXAM: CT HEAD WITHOUT CONTRAST  CT CERVICAL SPINE WITHOUT CONTRAST  TECHNIQUE: Multidetector CT imaging of the head and cervical spine was performed following the standard protocol without intravenous contrast. Multiplanar CT image reconstructions of the cervical spine were also generated.  COMPARISON: Prior CTs from 06/15/2014 and 07/04/2012.  FINDINGS: CT HEAD FINDINGS  Scalp soft tissues demonstrate no acute abnormality.  No acute abnormality about the globes and orbits. Ocular calcifications on the left is stable.  Scattered mucosal thickening within the visualized right maxillary sinus. Paranasal sinuses are otherwise clear. Trace right mastoid effusion noted.  Calvarium intact.  Scattered vascular calcifications noted within the carotid siphons. Generalized atrophy with chronic microvascular ischemic disease.  No acute intracranial hemorrhage. No evidence for large vessel territory infarct. No mass lesion, midline shift, or mass effect. Ventricular prominence related to global parenchymal volume loss present without hydrocephalus. No extra-axial fluid collection.  CT CERVICAL SPINE FINDINGS  Trace anterolisthesis of C3 on C4, C7 on T1, and T1 on T2. Vertebral bodies otherwise normally aligned with preservation of the normal lumbar lordosis. Vertebral body heights are preserved. Normal C1-2 articulations are intact. No prevertebral soft tissue swelling. No acute fracture or listhesis.  Moderate to advanced multilevel degenerative  spondylolysis, greatest at C6-7. Severe degenerative osteoarthritic changes present about the C1-2 articulation. Multilevel facet arthrosis noted.  Visualized soft tissues of the neck demonstrate no acute abnormality. Scattered vascular calcifications about the carotid bifurcations. Visualized lung apices are clear without evidence of apical pneumothorax.  IMPRESSION: CT BRAIN:  1. No acute intracranial process. 2. Stable atrophy with chronic microvascular ischemic disease.  CT CERVICAL SPINE:  1. No acute traumatic injury within the cervical spine. 2. Stable moderate to advanced multilevel degenerative spondylolysis and facet arthrosis.       ____________________________________________   PROCEDURES  Procedure(s) performed: None  Critical Care performed: None  ____________________________________________   INITIAL IMPRESSION / ASSESSMENT AND PLAN / ED COURSE   Pertinent labs & imaging results that were available during my care of the patient were reviewed by me and considered in my medical decision making (see chart for details).  Reviewed negative imaging studies for traumatic injury.  Ok for discharge home.  CONSULTATIONS: None   Patient / Family / Caregiver informed of clinical course, medical decision-making process, and agree with plan.   I discussed return precautions, follow-up instructions, and discharged instructions with patient and/or family.     ____________________________________________   FINAL CLINICAL IMPRESSION(S) / ED DIAGNOSES  Final diagnoses:  Head injury, initial encounter        Governor Rooksebecca Talayia Hjort, MD 09/30/15 [phone]

## 2016-02-26 ENCOUNTER — Emergency Department
Admission: EM | Admit: 2016-02-26 | Discharge: 2016-02-26 | Disposition: A | Discharge status: Home / Self Care | Payer: Medicare Other | Attending: Emergency Medicine | Admitting: Emergency Medicine

## 2016-02-26 ENCOUNTER — Emergency Department: Payer: Medicare Other

## 2016-02-26 ENCOUNTER — Encounter: Payer: Self-pay | Admitting: Emergency Medicine

## 2016-02-26 DIAGNOSIS — K529 Noninfective gastroenteritis and colitis, unspecified: Secondary | ICD-10-CM | POA: Insufficient documentation

## 2016-02-26 DIAGNOSIS — Z79899 Other long term (current) drug therapy: Secondary | ICD-10-CM | POA: Diagnosis not present

## 2016-02-26 DIAGNOSIS — E039 Hypothyroidism, unspecified: Secondary | ICD-10-CM | POA: Diagnosis not present

## 2016-02-26 DIAGNOSIS — R112 Nausea with vomiting, unspecified: Secondary | ICD-10-CM | POA: Diagnosis present

## 2016-02-26 DIAGNOSIS — F0390 Unspecified dementia without behavioral disturbance: Secondary | ICD-10-CM | POA: Insufficient documentation

## 2016-02-26 LAB — TROPONIN I: Troponin I: <0.03 ng/mL (ref <0.03)

## 2016-02-26 LAB — CBC WITH DIFFERENTIAL/PLATELET
Basophils Absolute: 0.1 10*3/uL (ref 0–0.1)
Basophils Relative: 1 %
EOS ABS: 0 10*3/uL (ref 0–0.7)
EOS PCT: 0 %
HCT: 34.5 % — ABNORMAL LOW (ref 35.0–47.0)
Hemoglobin: 11.6 g/dL — ABNORMAL LOW (ref 12.0–16.0)
LYMPHS ABS: 0.9 10*3/uL — AB (ref 1.0–3.6)
LYMPHS PCT: 8 %
MCH: 31.2 pg (ref 26.0–34.0)
MCHC: 33.5 g/dL (ref 32.0–36.0)
MCV: 93 fL (ref 80.0–100.0)
MONO ABS: 1.2 10*3/uL — AB (ref 0.2–0.9)
MONOS PCT: 10 %
Neutro Abs: 9.8 10*3/uL — ABNORMAL HIGH (ref 1.4–6.5)
Neutrophils Relative %: 81 %
PLATELETS: 307 10*3/uL (ref 150–440)
RBC: 3.71 MIL/uL — ABNORMAL LOW (ref 3.80–5.20)
RDW: 13.2 % (ref 11.5–14.5)
WBC: 12 10*3/uL — ABNORMAL HIGH (ref 3.6–11.0)

## 2016-02-26 LAB — COMPREHENSIVE METABOLIC PANEL
ALBUMIN: 3.8 g/dL (ref 3.5–5.0)
ALK PHOS: 66 U/L (ref 38–126)
ALT: 14 U/L (ref 14–54)
ANION GAP: 9 (ref 5–15)
AST: 26 U/L (ref 15–41)
BUN: 24 mg/dL — AB (ref 6–20)
CALCIUM: 9.1 mg/dL (ref 8.9–10.3)
CO2: 27 mmol/L (ref 22–32)
CREATININE: 1.15 mg/dL — AB (ref 0.44–1.00)
Chloride: 105 mmol/L (ref 101–111)
GFR calc Af Amer: 46 mL/min — ABNORMAL LOW (ref >60)
GFR calc non Af Amer: 39 mL/min — ABNORMAL LOW (ref >60)
GLUCOSE: 120 mg/dL — AB (ref 65–99)
Potassium: 3.6 mmol/L (ref 3.5–5.1)
Sodium: 141 mmol/L (ref 135–145)
TOTAL PROTEIN: 7.5 g/dL (ref 6.5–8.1)
Total Bilirubin: 0.3 mg/dL (ref 0.3–1.2)

## 2016-02-26 LAB — URINALYSIS, COMPLETE (UACMP) WITH MICROSCOPIC
Bacteria, UA: NONE SEEN
Bilirubin Urine: NEGATIVE
Glucose, UA: NEGATIVE mg/dL
Hgb urine dipstick: NEGATIVE
KETONES UR: 20 mg/dL — AB
Leukocytes, UA: NEGATIVE
Nitrite: NEGATIVE
PH: 5 (ref 5.0–8.0)
Protein, ur: 30 mg/dL — AB
SPECIFIC GRAVITY, URINE: 1.025 (ref 1.005–1.030)

## 2016-02-26 LAB — LIPASE, BLOOD: Lipase: 18 U/L (ref 11–51)

## 2016-02-26 LAB — LACTIC ACID, PLASMA: LACTIC ACID, VENOUS: 1.9 mmol/L (ref 0.5–1.9)

## 2016-02-26 MED ORDER — SODIUM CHLORIDE 0.9 % IV BOLUS (SEPSIS)
500.0000 mL | Freq: Once | INTRAVENOUS | Status: AC
  Administered 2016-02-26: 500 mL via INTRAVENOUS

## 2016-02-26 MED ORDER — ONDANSETRON HCL 4 MG PO TABS: 4.0000 mg | ORAL_TABLET | Freq: Three times a day (TID) | ORAL | 0 refills | Status: AC | PRN

## 2016-02-26 NOTE — ED Notes (Signed)
Pt is moved to hallway at this time. Pt awaiting transport back to the facility. Daughter at the bedside.

## 2016-02-26 NOTE — ED Notes (Signed)
ED secretary notified of need of non emergency traffic back to [facility].

## 2016-02-26 NOTE — ED Notes (Signed)
Pt is awaiting transport back to [facility].

## 2016-02-26 NOTE — ED Provider Notes (Addendum)
[facility] Emergency Department Provider Note  ____________________________________________   I have reviewed the triage vital signs and the nursing notes.   HISTORY  Chief Complaint Nausea    HPI Amber Powers is a 83 y.o. female  w significant dementia presents today with having thrown up apparently once this morning with some diarrhea as well. She cannot give a history. History is per daughter. Also EMS.Level 5 chart caveat; no further history available due to patient status. Patient is at her baseline otherwise. She has had a slight cough since arrival here.       Past Medical History:  Diagnosis Date  . Dementia   . Hypothyroid     Patient Active Problem List   Diagnosis Date Noted  . Altered mental state 06/18/2014    No past surgical history on file.  Prior to Admission medications   Medication Sig Start Date End Date Taking? Authorizing Provider  acetaminophen (TYLENOL) 500 MG tablet Take 500 mg by mouth 3 (three) times daily. For Fever up to 101, for headache, for pain    Yes Historical Provider, MD  amLODipine (NORVASC) 5 MG tablet Take 2.5 mg by mouth daily.    Yes Historical Provider, MD  busPIRone (BUSPAR) 5 MG tablet Take 5 mg by mouth 2 (two) times daily.   Yes Historical Provider, MD  Cholecalciferol (VITAMIN D) 2000 units CAPS Take 2,000 Units by mouth daily.   Yes Historical Provider, MD  docusate (COLACE) 50 MG/5ML liquid Take 100 mg by mouth at bedtime as needed for mild constipation.   Yes Historical Provider, MD  HYDROcodone-acetaminophen (NORCO/VICODIN) 5-325 MG per tablet Take 1 tablet by mouth 2 (two) times daily.    Yes Historical Provider, MD  levothyroxine (SYNTHROID, LEVOTHROID) 75 MCG tablet Take 75 mcg by mouth daily before breakfast.   Yes Historical Provider, MD  magic mouthwash SOLN Take 15 mLs by mouth.   Yes Historical Provider, MD  Polyethylene Glycol 3350 GRAN 17 g by Does not apply route 2 (two) times  daily.   Yes Historical Provider, MD  sertraline (ZOLOFT) 50 MG tablet Take 50 mg by mouth daily.   Yes Historical Provider, MD  vitamin B-12 (CYANOCOBALAMIN) 1000 MCG tablet Take 1,000 mcg by mouth daily.   Yes Historical Provider, MD  alum & mag hydroxide-simeth (MAALOX/MYLANTA) 200-200-20 MG/5ML suspension Take 30 mLs by mouth every 6 (six) hours as needed for indigestion or heartburn.    Historical Provider, MD  guaifenesin (ROBITUSSIN) 100 MG/5ML syrup Take 200 mg by mouth every 6 (six) hours as needed for cough.    Historical Provider, MD  Neomycin-Bacitracin-Polymyxin (TRIPLE ANTIBIOTIC) [phone] OINT Apply topically 4 (four) times daily as needed. For skin tear, abrasions, or minor irritations    Historical Provider, MD    Allergies Patient has no known allergies.  No family history on file.  Social History Social History  Substance Use Topics  . Smoking status: Unknown If Ever Smoked  . Smokeless tobacco: Never Used  . Alcohol use No    Review of Systems Level 5 chart caveat; no further history available due to patient status. ____________________________________________   PHYSICAL EXAM:  VITAL SIGNS: ED Triage Vitals  Enc Vitals Group     BP 02/26/16 1339 (!) 95/55     Pulse Rate 02/26/16 1339 61     Resp 02/26/16 1339 18     Temp 02/26/16 1339 97.8 F (36.6 C)     Temp Source 02/26/16 1339 Axillary  SpO2 02/26/16 1339 94 %     Weight 02/26/16 1340 145 lb (65.8 kg)     Height 02/26/16 1340 5\' 2"  (1.575 m)     Head Circumference --      Peak Flow --      Pain Score --      Pain Loc --      Pain Edu? --      Excl. in GC? --     Constitutional: Alert andIn no acute distress dementia noted Eyes: Conjunctivae are normal. PERRL. EOMI. Head: Atraumatic. Nose: No congestion/rhinnorhea. Mouth/Throat: Mucous membranes are moist.  Oropharynx non-erythematous. Neck: No stridor.   Nontender with no meningismus Cardiovascular: Normal rate, regular rhythm.  Grossly normal heart sounds.  Good peripheral circulation. Respiratory: Normal respiratory effort.  No retractions. Lungs CTAB. Abdominal: Soft and nontender. No distention. No guarding no rebound Back:  There is no focal tenderness or step off.  there is no midline tenderness there are no lesions noted. there is no CVA tenderness Musculoskeletal: No lower extremity tenderness, no upper extremity tenderness. No joint effusions, no DVT signs strong distal pulses no edema Neurologic:  No obvious focality, dementia limits exam Skin:  Skin is warm, dry and intact. No rash noted.   ____________________________________________   LABS (all labs ordered are listed, but only abnormal results are displayed)  Labs Reviewed  COMPREHENSIVE METABOLIC PANEL - Abnormal; Notable for the following:       Result Value   Glucose, Bld 120 (*)    BUN 24 (*)    Creatinine, Ser 1.15 (*)    GFR calc non Af Amer 39 (*)    GFR calc Af Amer 46 (*)    All other components within normal limits  CBC WITH DIFFERENTIAL/PLATELET - Abnormal; Notable for the following:    WBC 12.0 (*)    RBC 3.71 (*)    Hemoglobin 11.6 (*)    HCT 34.5 (*)    Neutro Abs 9.8 (*)    Lymphs Abs 0.9 (*)    Monocytes Absolute 1.2 (*)    All other components within normal limits  URINALYSIS, COMPLETE (UACMP) WITH MICROSCOPIC - Abnormal; Notable for the following:    Color, Urine YELLOW (*)    APPearance CLEAR (*)    Ketones, ur 20 (*)    Protein, ur 30 (*)    Squamous Epithelial / LPF 0-5 (*)    All other components within normal limits  LIPASE, BLOOD  TROPONIN I  LACTIC ACID, PLASMA   ____________________________________________  EKG  I personally interpreted any EKGs ordered by me or triage  ____________________________________________  RADIOLOGY  I reviewed any imaging ordered by me or triage that were performed during my shift and, if possible, patient and/or family made aware of any abnormal  findings. ____________________________________________   PROCEDURES  Procedure(s) performed: None  Procedures  Critical Care performed: None  ____________________________________________   INITIAL IMPRESSION / ASSESSMENT AND PLAN / ED COURSE  Pertinent labs & imaging results that were available during my care of the patient were reviewed by me and considered in my medical decision making (see chart for details).  Ratio nausea vomiting and diarrhea. Has not had any of the above here. Blood work and vitals reassuring. I did talk to the daughter she would prefer to go home rather than for observation. He do not think this is unreasonable. Patient has no complaints or symptoms here. She is significantly demented. The daughter states that at baseline they usually try not to  even check blood work on the patient because at this point she is "too old for all that". I don't think any of this is unreasonable. Very remarkably on concerning workup here. We have given her IV fluid for her slight BUN/creatinine ratio increased. Otherwise her creatinine is better than it was last time we checked a year ago. Family does understand that at her age there certainly she has of decompensation she goes home but there is equally large chance that she will get worse if admitted to the hospital and they prefer to she goes back to her nursing home. She is tolerating by mouth at baseline. Family very comfortable with this plan.   ----------------------------------------- 9:07 PM on 02/26/2016 -----------------------------------------  Patient in no acute distress, awaiting transport. Signed out to Dr. Langston Masker in the event of any decompensation.  Clinical Course    ____________________________________________   FINAL CLINICAL IMPRESSION(S) / ED DIAGNOSES  Final diagnoses:  Gastroenteritis      This chart was dictated using voice recognition software.  Despite best efforts to proofread,  errors can  occur which can change meaning.      Jeanmarie Plant, MD 02/26/16 1732    Jeanmarie Plant, MD 02/26/16 2108

## 2016-02-26 NOTE — ED Triage Notes (Signed)
Per [facility], patient comes from [facility] with c/o N/V/D. [facility] has an outbreak of norovirus that is spreading to the majority of the residents. [facility] her for further evaluation. Hx of dementia. Patient is wheelchair bound and disoriented x4. Patient unable to follow commands.

## 2016-03-08 ENCOUNTER — Emergency Department: Payer: Medicare Other

## 2016-03-08 ENCOUNTER — Encounter: Payer: Self-pay | Admitting: Emergency Medicine

## 2016-03-08 ENCOUNTER — Inpatient Hospital Stay
Admission: EM | Admit: 2016-03-08 | Discharge: 2016-03-10 | DRG: 640 | Disposition: A | Discharge status: Hospice | Payer: Medicare Other | Attending: Internal Medicine | Admitting: Internal Medicine

## 2016-03-08 DIAGNOSIS — R7989 Other specified abnormal findings of blood chemistry: Secondary | ICD-10-CM

## 2016-03-08 DIAGNOSIS — D72829 Elevated white blood cell count, unspecified: Secondary | ICD-10-CM | POA: Diagnosis present

## 2016-03-08 DIAGNOSIS — G309 Alzheimer's disease, unspecified: Secondary | ICD-10-CM | POA: Diagnosis present

## 2016-03-08 DIAGNOSIS — Z79891 Long term (current) use of opiate analgesic: Secondary | ICD-10-CM

## 2016-03-08 DIAGNOSIS — E860 Dehydration: Secondary | ICD-10-CM | POA: Diagnosis present

## 2016-03-08 DIAGNOSIS — E870 Hyperosmolality and hypernatremia: Principal | ICD-10-CM

## 2016-03-08 DIAGNOSIS — K8020 Calculus of gallbladder without cholecystitis without obstruction: Secondary | ICD-10-CM | POA: Diagnosis present

## 2016-03-08 DIAGNOSIS — R740 Nonspecific elevation of levels of transaminase and lactic acid dehydrogenase [LDH]: Secondary | ICD-10-CM | POA: Diagnosis present

## 2016-03-08 DIAGNOSIS — R0602 Shortness of breath: Secondary | ICD-10-CM | POA: Diagnosis not present

## 2016-03-08 DIAGNOSIS — R4182 Altered mental status, unspecified: Secondary | ICD-10-CM

## 2016-03-08 DIAGNOSIS — I10 Essential (primary) hypertension: Secondary | ICD-10-CM | POA: Diagnosis present

## 2016-03-08 DIAGNOSIS — K449 Diaphragmatic hernia without obstruction or gangrene: Secondary | ICD-10-CM | POA: Diagnosis present

## 2016-03-08 DIAGNOSIS — R627 Adult failure to thrive: Secondary | ICD-10-CM | POA: Diagnosis present

## 2016-03-08 DIAGNOSIS — I700 Atherosclerosis of aorta: Secondary | ICD-10-CM | POA: Diagnosis present

## 2016-03-08 DIAGNOSIS — R7401 Elevation of levels of liver transaminase levels: Secondary | ICD-10-CM

## 2016-03-08 DIAGNOSIS — R778 Other specified abnormalities of plasma proteins: Secondary | ICD-10-CM

## 2016-03-08 DIAGNOSIS — F0280 Dementia in other diseases classified elsewhere without behavioral disturbance: Secondary | ICD-10-CM | POA: Diagnosis present

## 2016-03-08 DIAGNOSIS — Z79899 Other long term (current) drug therapy: Secondary | ICD-10-CM

## 2016-03-08 DIAGNOSIS — E039 Hypothyroidism, unspecified: Secondary | ICD-10-CM | POA: Diagnosis present

## 2016-03-08 DIAGNOSIS — I248 Other forms of acute ischemic heart disease: Secondary | ICD-10-CM | POA: Diagnosis present

## 2016-03-08 DIAGNOSIS — G9341 Metabolic encephalopathy: Secondary | ICD-10-CM

## 2016-03-08 DIAGNOSIS — Z66 Do not resuscitate: Secondary | ICD-10-CM | POA: Diagnosis present

## 2016-03-08 LAB — COMPREHENSIVE METABOLIC PANEL
ALBUMIN: 4.4 g/dL (ref 3.5–5.0)
ALK PHOS: 109 U/L (ref 38–126)
ALT: 69 U/L — AB (ref 14–54)
AST: 92 U/L — AB (ref 15–41)
Anion gap: 10 (ref 5–15)
BUN: 54 mg/dL — AB (ref 6–20)
CALCIUM: 9.8 mg/dL (ref 8.9–10.3)
CO2: 26 mmol/L (ref 22–32)
CREATININE: 1.15 mg/dL — AB (ref 0.44–1.00)
Chloride: 120 mmol/L — ABNORMAL HIGH (ref 101–111)
GFR calc Af Amer: 46 mL/min — ABNORMAL LOW (ref >60)
GFR calc non Af Amer: 39 mL/min — ABNORMAL LOW (ref >60)
GLUCOSE: 121 mg/dL — AB (ref 65–99)
Potassium: 3.6 mmol/L (ref 3.5–5.1)
Sodium: 156 mmol/L — ABNORMAL HIGH (ref 135–145)
Total Bilirubin: 0.3 mg/dL (ref 0.3–1.2)
Total Protein: 8.5 g/dL — ABNORMAL HIGH (ref 6.5–8.1)

## 2016-03-08 LAB — CBC WITH DIFFERENTIAL/PLATELET
BASOS ABS: 0.2 10*3/uL — AB (ref 0–0.1)
Basophils Relative: 1 %
Eosinophils Absolute: 0 10*3/uL (ref 0–0.7)
Eosinophils Relative: 0 %
HEMATOCRIT: 45.5 % (ref 35.0–47.0)
HEMOGLOBIN: 14.7 g/dL (ref 12.0–16.0)
LYMPHS PCT: 16 %
Lymphs Abs: 3 10*3/uL (ref 1.0–3.6)
MCH: 30.2 pg (ref 26.0–34.0)
MCHC: 32.3 g/dL (ref 32.0–36.0)
MCV: 93.4 fL (ref 80.0–100.0)
MONOS PCT: 13 %
Monocytes Absolute: 2.4 10*3/uL — ABNORMAL HIGH (ref 0.2–0.9)
NEUTROS ABS: 13.1 10*3/uL — AB (ref 1.4–6.5)
Neutrophils Relative %: 70 %
Platelets: 409 10*3/uL (ref 150–440)
RBC: 4.87 MIL/uL (ref 3.80–5.20)
RDW: 13.6 % (ref 11.5–14.5)
WBC: 18.7 10*3/uL — ABNORMAL HIGH (ref 3.6–11.0)

## 2016-03-08 LAB — TROPONIN I
TROPONIN I: 0.03 ng/mL — AB (ref <0.03)
Troponin I: 0.03 ng/mL (ref <0.03)

## 2016-03-08 MED ORDER — LEVOFLOXACIN IN D5W 250 MG/50ML IV SOLN
250.0000 mg | INTRAVENOUS | Status: DC
  Administered 2016-03-09: 250 mg via INTRAVENOUS
  Filled 2016-03-08 (×2): qty 50

## 2016-03-08 MED ORDER — ONDANSETRON HCL 4 MG PO TABS: 4.0000 mg | ORAL_TABLET | Freq: Four times a day (QID) | ORAL | Status: DC | PRN

## 2016-03-08 MED ORDER — DOCUSATE SODIUM 50 MG/5ML PO LIQD
100.0000 mg | Freq: Every evening | ORAL | Status: DC | PRN
  Filled 2016-03-08: qty 10

## 2016-03-08 MED ORDER — LEVOTHYROXINE SODIUM 75 MCG PO TABS
75.0000 ug | ORAL_TABLET | Freq: Every day | ORAL | Status: DC
Start: 2016-03-09 — End: 2016-03-10
  Administered 2016-03-09 – 2016-03-10 (×2): 75 ug via ORAL
  Filled 2016-03-08 (×2): qty 1

## 2016-03-08 MED ORDER — VITAMIN D 1000 UNITS PO TABS
2000.0000 [IU] | ORAL_TABLET | Freq: Every day | ORAL | Status: DC
  Administered 2016-03-09 – 2016-03-10 (×2): 2000 [IU] via ORAL
  Filled 2016-03-08 (×2): qty 2

## 2016-03-08 MED ORDER — VITAMIN B-12 1000 MCG PO TABS
1000.0000 ug | ORAL_TABLET | Freq: Every day | ORAL | Status: DC
  Administered 2016-03-09 – 2016-03-10 (×2): 1000 ug via ORAL
  Filled 2016-03-08 (×2): qty 1

## 2016-03-08 MED ORDER — OSELTAMIVIR PHOSPHATE 30 MG PO CAPS
30.0000 mg | ORAL_CAPSULE | Freq: Every day | ORAL | Status: DC
  Administered 2016-03-09: 30 mg via ORAL
  Filled 2016-03-08: qty 1

## 2016-03-08 MED ORDER — ALUM & MAG HYDROXIDE-SIMETH 200-200-20 MG/5ML PO SUSP: 30.0000 mL | Freq: Four times a day (QID) | ORAL | Status: DC | PRN

## 2016-03-08 MED ORDER — ENOXAPARIN SODIUM 30 MG/0.3ML ~~LOC~~ SOLN
30.0000 mg | Freq: Every day | SUBCUTANEOUS | Status: DC
  Administered 2016-03-09 (×2): 30 mg via SUBCUTANEOUS
  Filled 2016-03-08 (×2): qty 0.3

## 2016-03-08 MED ORDER — POTASSIUM CL IN DEXTROSE 5% 20 MEQ/L IV SOLN
20.0000 meq | INTRAVENOUS | Status: DC
  Administered 2016-03-09 – 2016-03-10 (×4): 20 meq via INTRAVENOUS
  Filled 2016-03-08 (×9): qty 1000

## 2016-03-08 MED ORDER — NITROGLYCERIN 2 % TD OINT
0.5000 [in_us] | TOPICAL_OINTMENT | Freq: Four times a day (QID) | TRANSDERMAL | Status: DC
  Administered 2016-03-09 (×2): 0.5 [in_us] via TOPICAL
  Filled 2016-03-08: qty 0.5
  Filled 2016-03-08: qty 30

## 2016-03-08 MED ORDER — SODIUM CHLORIDE 0.45 % IV SOLN
INTRAVENOUS | Status: DC
  Administered 2016-03-08: 21:00:00 via INTRAVENOUS

## 2016-03-08 MED ORDER — AMLODIPINE BESYLATE 5 MG PO TABS
2.5000 mg | ORAL_TABLET | Freq: Every day | ORAL | Status: DC
  Administered 2016-03-09 – 2016-03-10 (×2): 2.5 mg via ORAL
  Filled 2016-03-08 (×2): qty 1

## 2016-03-08 MED ORDER — ONDANSETRON HCL 4 MG/2ML IJ SOLN: 4.0000 mg | Freq: Four times a day (QID) | INTRAMUSCULAR | Status: DC | PRN

## 2016-03-08 MED ORDER — ACETAMINOPHEN 500 MG PO TABS: 500.0000 mg | ORAL_TABLET | Freq: Three times a day (TID) | ORAL | Status: DC | PRN

## 2016-03-08 MED ORDER — BUSPIRONE HCL 5 MG PO TABS
5.0000 mg | ORAL_TABLET | Freq: Two times a day (BID) | ORAL | Status: DC
  Administered 2016-03-09 – 2016-03-10 (×4): 5 mg via ORAL
  Filled 2016-03-08 (×4): qty 1

## 2016-03-08 MED ORDER — SERTRALINE HCL 50 MG PO TABS
50.0000 mg | ORAL_TABLET | Freq: Every day | ORAL | Status: DC
  Administered 2016-03-09 – 2016-03-10 (×2): 50 mg via ORAL
  Filled 2016-03-08 (×2): qty 1

## 2016-03-08 MED ORDER — ONDANSETRON HCL 4 MG PO TABS: 4.0000 mg | ORAL_TABLET | Freq: Three times a day (TID) | ORAL | Status: DC | PRN

## 2016-03-08 MED ORDER — ASPIRIN 81 MG PO CHEW
81.0000 mg | CHEWABLE_TABLET | Freq: Every day | ORAL | Status: DC
Start: 2016-03-08 — End: 2016-03-10
  Administered 2016-03-09 – 2016-03-10 (×3): 81 mg via ORAL
  Filled 2016-03-08 (×3): qty 1

## 2016-03-08 MED ORDER — LEVOFLOXACIN IN D5W 500 MG/100ML IV SOLN
500.0000 mg | Freq: Once | INTRAVENOUS | Status: AC
  Administered 2016-03-08: 500 mg via INTRAVENOUS
  Filled 2016-03-08: qty 100

## 2016-03-08 NOTE — ED Notes (Addendum)
Patient family (POA) continues to refuse labs, agreed to chest xray. MD made aware. Will continue to monitor.

## 2016-03-08 NOTE — ED Triage Notes (Signed)
Patient from [facility] via [facility]. Staff reports finding patient with increased work of breathing and shortness of breath around 430pm. EMS reports room air saturation of 80% upon arrival. Patient placed on 15L non-rebreather with saturation rising to 100%. Upon arrival to ED room air O2 at 100%. Patient with auditory wheezing.

## 2016-03-08 NOTE — ED Provider Notes (Signed)
[facility] Emergency Department Provider Note  ____________________________________________   First MD Initiated Contact with Patient 03/08/16 1747     (approximate)  I have reviewed the triage vital signs and the nursing notes.   HISTORY  Chief Complaint Shortness of Breath   HPI Amber Powers is a 83 y.o. female with a history of dementia as well as hypothyroidism who is presenting to the emergency department today with decreased mental status as well as a gurgling noise when she is breathing. She was found this way in the dining area of her care home. The patient is not interactive and not talking at this time. She is accompanied at this time by her daughter who says that she is usually able to make eye contact and although she mumbles and usually talks incoherently she usually makes an effort to [employer].  Per EMS patient has not had a fever.   Past Medical History:  Diagnosis Date  . Dementia   . Hypothyroid     Patient Active Problem List   Diagnosis Date Noted  . Altered mental state 06/18/2014    History reviewed. No pertinent surgical history.  Prior to Admission medications   Medication Sig Start Date End Date Taking? Authorizing Provider  acetaminophen (TYLENOL) 500 MG tablet Take 500 mg by mouth 3 (three) times daily. For Fever up to 101, for headache, for pain    Yes Historical Provider, MD  alum & mag hydroxide-simeth (MAALOX/MYLANTA) 200-200-20 MG/5ML suspension Take 30 mLs by mouth every 6 (six) hours as needed for indigestion or heartburn.   Yes Historical Provider, MD  amLODipine (NORVASC) 2.5 MG tablet Take 2.5 mg by mouth daily.    Yes Historical Provider, MD  busPIRone (BUSPAR) 5 MG tablet Take 5 mg by mouth 2 (two) times daily.   Yes Historical Provider, MD  Cholecalciferol (VITAMIN D) 2000 units CAPS Take 2,000 Units by mouth daily.   Yes Historical Provider, MD  docusate (COLACE) 50 MG/5ML liquid Take 100 mg  by mouth at bedtime as needed for mild constipation.   Yes Historical Provider, MD  HYDROcodone-acetaminophen (NORCO/VICODIN) 5-325 MG per tablet Take 1 tablet by mouth 2 (two) times daily.    Yes Historical Provider, MD  levothyroxine (SYNTHROID, LEVOTHROID) 75 MCG tablet Take 75 mcg by mouth daily before breakfast.   Yes Historical Provider, MD  magic mouthwash SOLN Take 15 mLs by mouth.   Yes Historical Provider, MD  Neomycin-Bacitracin-Polymyxin (TRIPLE ANTIBIOTIC) [phone] OINT Apply topically 4 (four) times daily as needed. For skin tear, abrasions, or minor irritations   Yes Historical Provider, MD  ondansetron (ZOFRAN) 4 MG tablet Take 1 tablet (4 mg total) by mouth every 8 (eight) hours as needed for nausea or vomiting. 02/26/16  Yes Jeanmarie Plant, MD  oseltamivir (TAMIFLU) 75 MG capsule Take 75 mg by mouth 2 (two) times daily. 03/08/16  Yes Historical Provider, MD  Polyethylene Glycol 3350 GRAN 17 g by Does not apply route 2 (two) times daily.   Yes Historical Provider, MD  sertraline (ZOLOFT) 50 MG tablet Take 50 mg by mouth daily.   Yes Historical Provider, MD  vitamin B-12 (CYANOCOBALAMIN) 1000 MCG tablet Take 1,000 mcg by mouth daily.   Yes Historical Provider, MD    Allergies Patient has no known allergies.  No family history on file.  Social History Social History  Substance Use Topics  . Smoking status: Unknown If Ever Smoked  . Smokeless tobacco: Never Used  . Alcohol  use No    Review of Systems Level V caveat secondary to advanced dementia.  ____________________________________________    PHYSICAL EXAM:  VITAL SIGNS: ED Triage Vitals  Enc Vitals Group     BP 03/08/16 1736 (!) 141/86     Pulse Rate 03/08/16 1736 75     Resp 03/08/16 1736 (!) 26     Temp 03/08/16 1736 98 F (36.7 C)     Temp Source 03/08/16 1736 Rectal     SpO2 03/08/16 1736 100 %     Weight 03/08/16 1737 144 lb (65.3 kg)     Height 03/08/16 1737 5\' 2"  (1.575 m)     Head Circumference  --      Peak Flow --      Pain Score --      Pain Loc --      Pain Edu? --      Excl. in GC? --     Constitutional: Alert and in no acute distress. Eyes: Conjunctivae are normal. PERRL. EOMI. Head: Atraumatic. Nose: No congestion/rhinnorhea. Mouth/Throat: Mucous membranes are dry.  Oropharynx non-erythematous.  No foreign body visualized in the pharynx. Neck: No stridor.   Cardiovascular: Normal rate, regular rhythm. Grossly normal heart sounds.   Respiratory: Normal respiratory effort.  No retractions. Mild, bilateral rales throughout. Mild gurgling sounds in the pharynx alone no foreign body visualized. No secretions visualized. Gastrointestinal: Soft and nontender. No distention.  Musculoskeletal: No lower extremity tenderness nor edema.  No joint effusions. Neurologic: No gross focal neurologic deficits are appreciated. Skin:  Skin is warm, dry and intact. No rash noted.   ____________________________________________   LABS (all labs ordered are listed, but only abnormal results are displayed)  Labs Reviewed  CBC WITH DIFFERENTIAL/PLATELET - Abnormal; Notable for the following:       Result Value   WBC 18.7 (*)    Neutro Abs 13.1 (*)    Monocytes Absolute 2.4 (*)    Basophils Absolute 0.2 (*)    All other components within normal limits  COMPREHENSIVE METABOLIC PANEL - Abnormal; Notable for the following:    Sodium 156 (*)    Chloride 120 (*)    Glucose, Bld 121 (*)    BUN 54 (*)    Creatinine, Ser 1.15 (*)    Total Protein 8.5 (*)    AST 92 (*)    ALT 69 (*)    GFR calc non Af Amer 39 (*)    GFR calc Af Amer 46 (*)    All other components within normal limits  TROPONIN I - Abnormal; Notable for the following:    Troponin I 0.03 (*)    All other components within normal limits  URINALYSIS, COMPLETE (UACMP) WITH MICROSCOPIC   ____________________________________________  EKG  ED ECG REPORT I, Arelia LongestSchaevitz,  David M, the attending physician, personally viewed  and interpreted this ECG.   Date: 03/08/2016  EKG Time: 1735  Rate: 78  Rhythm: normal sinus rhythm  Axis: Normal axis  Intervals:Prolonged QTC at 531.  ST&T Change: No ST segment elevation or depression. T-wave inversion in V2. Possible secondary to lead placement.  ____________________________________________  RADIOLOGY  DG Chest 2 View (Final result)  Result time 03/08/16 19:09:55  Final result by Myles RosenthalJohn Stahl, MD (03/08/16 19:09:55)           Narrative:   CLINICAL DATA: Progressive shortness of breath today.  EXAM: CHEST 2 VIEW  COMPARISON: 02/26/2016  FINDINGS: The heart size and mediastinal contours are stable. Aortic atherosclerosis.  Mild right mid and lower lung scarring. No evidence of pulmonary infiltrate or edema. No evidence of pneumothorax or pleural effusion. Small hiatal hernia again noted.  IMPRESSION: No active cardiopulmonary disease.  Small hiatal hernia.  Aortic atherosclerosis.   Electronically Signed By: Myles Rosenthal M.D. On: 03/08/2016 19:09            ____________________________________________   PROCEDURES  Procedure(s) performed:   Procedures  Critical Care performed:   ____________________________________________   INITIAL IMPRESSION / ASSESSMENT AND PLAN / ED COURSE  Pertinent labs & imaging results that were available during my care of the patient were reviewed by me and considered in my medical decision making (see chart for details).  Also discussed the case with the power of attorney who is the patient's other daughter. We spoke on the phone and she would not like any blood work done at this time. She says that the patient was in the hospital last week and that she does not want the patient to undergo further testing because "she does not understand what you're doing."    ----------------------------------------- 8:10 PM on 03/08/2016 -----------------------------------------  Patient found of  hypernatremia as well as an elevated BUN. Her daughter is at the bedside and says that she hasn't been drinking over the past several days. Possible instigator of the dehydration and hypernatremia may have been a GI illness 1-2 weeks ago. Patient will be admitted to the hospital. We'll start IV fluids. Signed out to Dr. Seth Bake. The family is understanding of the plan and willing to comply.  ____________________________________________   FINAL CLINICAL IMPRESSION(S) / ED DIAGNOSES  Hypernatremia. Altered mental status. Failure to thrive.    NEW MEDICATIONS STARTED DURING THIS VISIT:  New Prescriptions   No medications on file     Note:  This document was prepared using Dragon voice recognition software and may include unintentional dictation errors.    Myrna Blazer, MD 03/08/16 2010

## 2016-03-08 NOTE — Progress Notes (Signed)
Pharmacist - Prescriber Communication  Tamiflu dose modified to 30 mg po daily for creatinine clearance 10 to 30 mL/min.  Ismerai Bin A. Thomas[place]son, Vermont[place]arm.D., BCPS Clinical Pharmacist 03/08/2016 2256

## 2016-03-08 NOTE — ED Notes (Signed)
Patient's daughter at bedside. Daughter states "She just had blood work and xrays done last week and we don't to have to stick her again." MD made aware and is at bedside, speaking with POA on phone.

## 2016-03-08 NOTE — Progress Notes (Signed)
ANTIBIOTIC CONSULT NOTE - INITIAL  Pharmacy Consult for levaquin renal dose adjustment Indication: Intra-abdominal infx  No Known Allergies  Patient Measurements: Height: 5\' 2"  (157.5 cm) Weight: 144 lb (65.3 kg) IBW/kg (Calculated) : 50.1 Adjusted Body Weight:   Vital Signs: Temp: 98 F (36.7 C) (01/20 1736) Temp Source: Rectal (01/20 1736) BP: 140/76 (01/20 2100) Pulse Rate: 77 (01/20 2100) Intake/Output from previous day: No intake/output data recorded. Intake/Output from this shift: No intake/output data recorded.  Labs:  Recent Labs  03/08/16 1853  WBC 18.7*  HGB 14.7  PLT 409  CREATININE 1.15*   Estimated Creatinine Clearance: 26.5 mL/min (by C-G formula based on SCr of 1.15 mg/dL (H)). No results for input(s): VANCOTROUGH, VANCOPEAK, VANCORANDOM, GENTTROUGH, GENTPEAK, GENTRANDOM, TOBRATROUGH, TOBRAPEAK, TOBRARND, AMIKACINPEAK, AMIKACINTROU, AMIKACIN in the last 72 hours.   Microbiology: No results found for this or any previous visit (from the past 720 hour(s)).  Medical History: Past Medical History:  Diagnosis Date  . Dementia   . Hypothyroid     Medications:  Scheduled:   Assessment: Patient came in altered, SOB and hypernatremic and is being started on levaquin for intra-abdominal infx. CrCl 26.5 ml/min  Plan:  Dose adjust to levaquin 500 mg x 1; then 250 mg daily for CrCl 26.5 ml/min  Will continue to monitor renal function and adjust as needed.  Thomasene Rippleavid Jakara Blatter, PharmD, BCPS Clinical Pharmacist 03/08/2016

## 2016-03-08 NOTE — Progress Notes (Signed)
  Advance care planning     Present: Dr Winona LegatoVaickute Ms. Cauthren,Sandra T   Diagnoses: Hyponatremia Elevated transaminase level Failure to thrive, adult Elevated troponin Leukocytosis   I discussed this patient's daughter, power of attorney for medical care the patient's condition, multiple medical problems, treatment and evaluation plan, discharge planning. Patient's daughters were informed that patient has free water deficit, consistent with inability to drink due to abdominal problems versus dementia or both. All questions were answered, patient's family voiced understanding. Patient is DO NOT RESUSCITATE per her prior request, form, however, was not brought from the facility, discussed this patient's family.     Time spent 18 minutes

## 2016-03-08 NOTE — H&P (Addendum)
[facility] [place] [facility]   PATIENT NAME: Amber Powers    MR#:  [id number]  DATE OF BIRTH:  [birth year]  DATE OF ADMISSION:  03/08/2016  PRIMARY CARE PHYSICIAN: PROVIDER NOT IN SYSTEM   REQUESTING/REFERRING PHYSICIAN:   CHIEF COMPLAINT:   Chief Complaint  Patient presents with  . Shortness of Breath    HISTORY OF PRESENT ILLNESS: Amber Powers  is a 83 y.o. female with a known history of Dementia, hypothyroidism, hypertension, who presented to the hospital with complaints of wheezing. According to patient's daughters, she was noted to have wheezing in the facility and was brought to emergency room. No fevers, shortness of breath, cough over the past few days. Patient did have diarrhea and acute bronchitis about a week ago for which she received a Z-Pak and improved. Patient herself is not able to provide review of systems due to dementia, altered mental status  PAST MEDICAL HISTORY:   Past Medical History:  Diagnosis Date  . Dementia   . Hypothyroid     PAST SURGICAL HISTORY: History reviewed. No pertinent surgical history.  SOCIAL HISTORY:  Social History  Substance Use Topics  . Smoking status: Unknown If Ever Smoked  . Smokeless tobacco: Never Used  . Alcohol use No    FAMILY HISTORY: No family history on file.  DRUG ALLERGIES: No Known Allergies  Review of Systems  Unable to perform ROS: Dementia    MEDICATIONS AT HOME:  Prior to Admission medications   Medication Sig Start Date End Date Taking? Authorizing Provider  acetaminophen (TYLENOL) 500 MG tablet Take 500 mg by mouth 3 (three) times daily. For Fever up to 101, for headache, for pain    Yes Historical Provider, MD  alum & mag hydroxide-simeth (MAALOX/MYLANTA) 200-200-20 MG/5ML suspension Take 30 mLs by mouth every 6 (six) hours as needed for indigestion or heartburn.   Yes Historical Provider, MD  amLODipine (NORVASC) 2.5 MG tablet Take 2.5 mg by mouth daily.    Yes  Historical Provider, MD  busPIRone (BUSPAR) 5 MG tablet Take 5 mg by mouth 2 (two) times daily.   Yes Historical Provider, MD  Cholecalciferol (VITAMIN D) 2000 units CAPS Take 2,000 Units by mouth daily.   Yes Historical Provider, MD  docusate (COLACE) 50 MG/5ML liquid Take 100 mg by mouth at bedtime as needed for mild constipation.   Yes Historical Provider, MD  HYDROcodone-acetaminophen (NORCO/VICODIN) 5-325 MG per tablet Take 1 tablet by mouth 2 (two) times daily.    Yes Historical Provider, MD  levothyroxine (SYNTHROID, LEVOTHROID) 75 MCG tablet Take 75 mcg by mouth daily before breakfast.   Yes Historical Provider, MD  magic mouthwash SOLN Take 15 mLs by mouth.   Yes Historical Provider, MD  Neomycin-Bacitracin-Polymyxin (TRIPLE ANTIBIOTIC) [phone] OINT Apply topically 4 (four) times daily as needed. For skin tear, abrasions, or minor irritations   Yes Historical Provider, MD  ondansetron (ZOFRAN) 4 MG tablet Take 1 tablet (4 mg total) by mouth every 8 (eight) hours as needed for nausea or vomiting. 02/26/16  Yes Jeanmarie PlantJames A McShane, MD  oseltamivir (TAMIFLU) 75 MG capsule Take 75 mg by mouth 2 (two) times daily. 03/08/16  Yes Historical Provider, MD  Polyethylene Glycol 3350 GRAN 17 g by Does not apply route 2 (two) times daily.   Yes Historical Provider, MD  sertraline (ZOLOFT) 50 MG tablet Take 50 mg by mouth daily.   Yes Historical Provider, MD  vitamin B-12 (CYANOCOBALAMIN) 1000 MCG tablet Take 1,000  mcg by mouth daily.   Yes Historical Provider, MD      PHYSICAL EXAMINATION:   VITAL SIGNS: Blood pressure 126/77, pulse 75, temperature 98 F (36.7 C), temperature source Rectal, resp. rate 20, height 5\' 2"  (1.575 m), weight 65.3 kg (144 lb), SpO2 100 %.  GENERAL:  83 y.o.-year-old patient lying in the bed with no acute distress. Somnolent, not able to open her eyes or converse, not able to get review of systems, does not follow commands.  EYES: Pupils equal, round, reactive to light and  accommodation. No scleral icterus. Extraocular muscles intact.  HEENT: Head atraumatic, normocephalic. Oropharynx and nasopharynx clear.  NECK:  Supple, no jugular venous distention. No thyroid enlargement, no tenderness.  LUNGS: Normal breath sounds bilaterally, no wheezing, rales,rhonchi or crepitation. No use of accessory muscles of respiration.  CARDIOVASCULAR: S1, S2 normal. No murmurs, rubs, or gallops.  ABDOMEN: Soft, mild discomfort in epigastric area but no rebound or guarding, nondistended. Bowel sounds present. No organomegaly or mass.  EXTREMITIES: No pedal edema, cyanosis, or clubbing.  NEUROLOGIC: Cranial nerves II through XII are intact. Muscle strength 5/5 in all extremities. Sensation intact. Gait not checked.  PSYCHIATRIC: The patient is very somnolent, not able to assess orientation, does not follow commands   SKIN: No obvious rash, lesion, or ulcer.   LABORATORY PANEL:   CBC  Recent Labs Lab 03/08/16 1853  WBC 18.7*  HGB 14.7  HCT 45.5  PLT 409  MCV 93.4  MCH 30.2  MCHC 32.3  RDW 13.6  LYMPHSABS 3.0  MONOABS 2.4*  EOSABS 0.0  BASOSABS 0.2*   ------------------------------------------------------------------------------------------------------------------  Chemistries   Recent Labs Lab 03/08/16 1853  NA 156*  K 3.6  CL 120*  CO2 26  GLUCOSE 121*  BUN 54*  CREATININE 1.15*  CALCIUM 9.8  AST 92*  ALT 69*  ALKPHOS 109  BILITOT 0.3   ------------------------------------------------------------------------------------------------------------------  Cardiac Enzymes  Recent Labs Lab 03/08/16 1853  TROPONINI 0.03*   ------------------------------------------------------------------------------------------------------------------  RADIOLOGY: Dg Chest 2 View  Result Date: 03/08/2016 CLINICAL DATA:  Progressive shortness of breath today. EXAM: CHEST  2 VIEW COMPARISON:  02/26/2016 FINDINGS: The heart size and mediastinal contours are stable.  Aortic atherosclerosis. Mild right mid and lower lung scarring. No evidence of pulmonary infiltrate or edema. No evidence of pneumothorax or pleural effusion. Small hiatal hernia again noted. IMPRESSION: No active cardiopulmonary disease. Small hiatal hernia. Aortic atherosclerosis. Electronically Signed   By: Myles Rosenthal M.D.   On: 03/08/2016 19:09   Ct Head Wo Contrast  Result Date: 03/08/2016 CLINICAL DATA:  Altered loc today per family, not as responsive, somnolent EXAM: CT HEAD WITHOUT CONTRAST TECHNIQUE: Contiguous axial images were obtained from the base of the skull through the vertex without intravenous contrast. COMPARISON:  09/30/2015 FINDINGS: Brain: No acute intracranial hemorrhage. No focal mass lesion. No CT evidence of acute infarction. No midline shift or mass effect. No hydrocephalus. Basilar cisterns are patent. Extensive cortical atrophy and proportional ventricular dilatation. Periventricular white matter hypodensities. Vascular: No hyperdense vessel or unexpected calcification. Skull: Normal. Negative for fracture or focal lesion. Sinuses/Orbits: Paranasal sinuses and mastoid air cells are clear. Orbits are clear. Other: None. IMPRESSION: 1. No acute intracranial findings.  No change from prior. 2. Atrophy and ventricular dilatation unchanged. Electronically Signed   By: Genevive Bi M.D.   On: 03/08/2016 20:10    EKG: Orders placed or performed during the hospital encounter of 03/08/16  . EKG 12-Lead  . EKG 12-Lead  . ED  EKG  . ED EKG  . EKG 12-Lead  . EKG 12-Lead  EKG in emergency room reveals sinus rhythm at 78 bpm, low voltage QRS, poor R-wave progression in V3, T-wave depressions in anterolateral leads  IMPRESSION AND PLAN:  Active Problems:   Hypernatremia   Metabolic encephalopathy   Elevated transaminase level   Elevated troponin   Leukocytosis  #1. Hypernatremia in patient with recent diarrhea, initiate IV fluids, , following sodium level frequently #2.  Elevated transaminase level, get adequate upper quadrant ultrasound to rule out cholecystitis #3 metabolic encephalopathy, neuro checks every 4 hours, supportive therapy, aspiration precautions #4. Elevated troponin, likely demand ischemia, aspirin therapy, nitroglycerin topically, follow cardiac enzymes 3 #5. Leukocytosis, concerning for intra-abdominal infection, initiate patient on levofloxacin, right upper quadrant ultrasound is pending, ordered   All the records are reviewed and case discussed with ED provider. Management plans discussed with the patient, family and they are in agreement.  CODE STATUS: Code Status History    Date Active Date Inactive Code Status Order ID Comments User Context   06/17/2014 10:37 AM 06/18/2014  4:41 PM DNR [id number]  Athena Masse, RN Inpatient    Questions for Most Recent Historical Code Status (Order [id number])    Question Answer Comment   In the event of cardiac or respiratory ARREST Do not call a "code blue"    In the event of cardiac or respiratory ARREST Do not perform Intubation, CPR, defibrillation or ACLS    In the event of cardiac or respiratory ARREST Use medication by any route, position, wound care, and other measures to relive pain and suffering. May use oxygen, suction and manual treatment of airway obstruction as needed for comfort.        TOTAL TIME TAKING CARE OF THIS PATIENT: 50 minutes.    Katharina Caper M.D on 03/08/2016 at 9:18 PM  Between 7am to 6pm - Pager - [phone] After 6pm go to [email or web address] - password [email or web address] [facility]  [place] [facility]  Office  [phone]  CC: Primary care physician; PROVIDER NOT IN SYSTEM

## 2016-03-09 ENCOUNTER — Inpatient Hospital Stay: Payer: Medicare Other

## 2016-03-09 LAB — COMPREHENSIVE METABOLIC PANEL
ALK PHOS: 87 U/L (ref 38–126)
ALT: 60 U/L — AB (ref 14–54)
ANION GAP: 8 (ref 5–15)
AST: 75 U/L — ABNORMAL HIGH (ref 15–41)
Albumin: 3.5 g/dL (ref 3.5–5.0)
BUN: 48 mg/dL — ABNORMAL HIGH (ref 6–20)
CALCIUM: 9.2 mg/dL (ref 8.9–10.3)
CO2: 28 mmol/L (ref 22–32)
CREATININE: 1.04 mg/dL — AB (ref 0.44–1.00)
Chloride: 121 mmol/L — ABNORMAL HIGH (ref 101–111)
GFR, EST AFRICAN AMERICAN: 52 mL/min — AB (ref >60)
GFR, EST NON AFRICAN AMERICAN: 45 mL/min — AB (ref >60)
Glucose, Bld: 148 mg/dL — ABNORMAL HIGH (ref 65–99)
Potassium: 3.3 mmol/L — ABNORMAL LOW (ref 3.5–5.1)
SODIUM: 157 mmol/L — AB (ref 135–145)
TOTAL PROTEIN: 7.1 g/dL (ref 6.5–8.1)
Total Bilirubin: 0.5 mg/dL (ref 0.3–1.2)

## 2016-03-09 LAB — SODIUM
SODIUM: 157 mmol/L — AB (ref 135–145)
SODIUM: 154 mmol/L — AB (ref 135–145)

## 2016-03-09 LAB — TROPONIN I: Troponin I: <0.03 ng/mL (ref <0.03)

## 2016-03-09 MED ORDER — HYDROCODONE-ACETAMINOPHEN 5-325 MG PO TABS
1.0000 | ORAL_TABLET | Freq: Two times a day (BID) | ORAL | Status: DC | PRN
Start: 2016-03-09 — End: 2016-03-10

## 2016-03-09 NOTE — Plan of Care (Signed)
Problem: Education: Goal: Knowledge of [facility] Education information/materials will improve Outcome: Progressing Daughters oriented to room and equipment, POC reviewed  .  Problem: Safety: Goal: Ability to remain free from injury will improve Outcome: Progressing Pt in bed, bed alarm set. Call bell within reach, daughter x 2 at bedside.   Problem: Skin Integrity: Goal: Risk for impaired skin integrity will decrease Outcome: Progressing Pt turned q2hours, pt cleaned prn post incontinence of urine.   Problem: Activity: Goal: Risk for activity intolerance will decrease Outcome: Progressing Pt up with 1 person assist per daughter.   Problem: Fluid Volume: Goal: Ability to maintain a balanced intake and output will improve Outcome: Progressing IVF per MD order, PO intake offered. Thickened liquids, pt to be assessed by Speech.  Problem: Nutrition: Goal: Adequate nutrition will be maintained Outcome: Progressing Pt on a dys/mech soft diet.

## 2016-03-09 NOTE — Progress Notes (Addendum)
[facility] [place] at [facility]   PATIENT NAME: Amber Powers    MR#:  [id number]  DATE OF BIRTH:  17-Oct-1919  SUBJECTIVE:  Came in with increasing weakness and fouind to have elveated sodium nonconversant due to advacne dementia Eats well   REVIEW OF SYSTEMS:   Review of Systems  Constitutional: Negative for chills, fever and weight loss.  HENT: Negative for ear discharge, ear pain and nosebleeds.   Eyes: Negative for blurred vision, pain and discharge.  Respiratory: Negative for sputum production, shortness of breath, wheezing and stridor.   Cardiovascular: Negative for chest pain, palpitations, orthopnea and PND.  Gastrointestinal: Negative for abdominal pain, diarrhea, nausea and vomiting.  Genitourinary: Negative for frequency and urgency.  Musculoskeletal: Negative for back pain and joint pain.  Neurological: Positive for weakness. Negative for sensory change, speech change and focal weakness.  Psychiatric/Behavioral: Negative for depression and hallucinations. The patient is not nervous/anxious.    Tolerating Diet:yes Tolerating PT: very limited  DRUG ALLERGIES:  No Known Allergies  VITALS:  Blood pressure (!) 99/54, pulse 81, temperature 97.6 F (36.4 C), temperature source Oral, resp. rate 17, height 5\' 2"  (1.575 m), weight 58.1 kg (128 lb 1.6 oz), SpO2 95 %.  PHYSICAL EXAMINATION:   Physical Exam  GENERAL:  83 y.o.-year-old patient lying in the bed with no acute distress.  EYES: Pupils equal, round, reactive to light and accommodation. No scleral icterus. Extraocular muscles intact.  HEENT: Head atraumatic, normocephalic. Oropharynx and nasopharynx clear.  NECK:  Supple, no jugular venous distention. No thyroid enlargement, no tenderness.  LUNGS: Normal breath sounds bilaterally, no wheezing, rales, rhonchi. No use of accessory muscles of respiration.  CARDIOVASCULAR: S1, S2 normal. No murmurs, rubs, or gallops.  ABDOMEN: Soft,  nontender, nondistended. Bowel sounds present. No organomegaly or mass.  EXTREMITIES: No cyanosis, clubbing or edema b/l.    NEUROLOGIC: Cranial nerves II through XII are intact. No focal Motor or sensory deficits b/l.   PSYCHIATRIC:  patient is alert and oriented x 3.  SKIN: No obvious rash, lesion, or ulcer.   LABORATORY PANEL:  CBC  Recent Labs Lab 03/08/16 1853  WBC 18.7*  HGB 14.7  HCT 45.5  PLT 409    Chemistries   Recent Labs Lab 03/09/16 0601 03/09/16 0858  NA 157* 154*  K 3.3*  --   CL 121*  --   CO2 28  --   GLUCOSE 148*  --   BUN 48*  --   CREATININE 1.04*  --   CALCIUM 9.2  --   AST 75*  --   ALT 60*  --   ALKPHOS 87  --   BILITOT 0.5  --    Cardiac Enzymes  Recent Labs Lab 03/09/16 0601  TROPONINI <0.03   RADIOLOGY:  Dg Chest 2 View  Result Date: 03/08/2016 CLINICAL DATA:  Progressive shortness of breath today. EXAM: CHEST  2 VIEW COMPARISON:  02/26/2016 FINDINGS: The heart size and mediastinal contours are stable. Aortic atherosclerosis. Mild right mid and lower lung scarring. No evidence of pulmonary infiltrate or edema. No evidence of pneumothorax or pleural effusion. Small hiatal hernia again noted. IMPRESSION: No active cardiopulmonary disease. Small hiatal hernia. Aortic atherosclerosis. Electronically Signed   By: Myles RosenthalJohn  Stahl M.D.   On: 03/08/2016 19:09   Ct Head Wo Contrast  Result Date: 03/08/2016 CLINICAL DATA:  Altered loc today per family, not as responsive, somnolent EXAM: CT HEAD WITHOUT CONTRAST TECHNIQUE: Contiguous axial images were obtained from  the base of the skull through the vertex without intravenous contrast. COMPARISON:  09/30/2015 FINDINGS: Brain: No acute intracranial hemorrhage. No focal mass lesion. No CT evidence of acute infarction. No midline shift or mass effect. No hydrocephalus. Basilar cisterns are patent. Extensive cortical atrophy and proportional ventricular dilatation. Periventricular white matter hypodensities.  Vascular: No hyperdense vessel or unexpected calcification. Skull: Normal. Negative for fracture or focal lesion. Sinuses/Orbits: Paranasal sinuses and mastoid air cells are clear. Orbits are clear. Other: None. IMPRESSION: 1. No acute intracranial findings.  No change from prior. 2. Atrophy and ventricular dilatation unchanged. Electronically Signed   By: Genevive Bi M.D.   On: 03/08/2016 20:10   US Abdomen Limited Ruq  Result Date: 03/09/2016 CLINICAL DATA:  Elevated LFTs EXAM: US ABDOMEN LIMITED - RIGHT UPPER QUADRANT COMPARISON:  None. FINDINGS: Gallbladder: Cholelithiasis is identified with no Murphy's sign, pericholecystic fluid, or wall thickening. A small amount of sludge is identified as well. Common bile duct: Diameter: 5.8 mm Liver: No focal lesion identified. Within normal limits in parenchymal echogenicity. IMPRESSION: 1. Cholelithiasis and sludge with no wall thickening, pericholecystic fluid, or Murphy's sign. 2. 1.9 cm right renal cyst. Electronically Signed   By: Gerome Sam III M.D   On: 03/09/2016 10:11   ASSESSMENT AND PLAN:  Amber Powers  is a 83 y.o. female with a known history of Dementia, hypothyroidism, hypertension, who presented to the hospital with complaints of wheezing. According to patient's daughters, she was noted to have wheezing in the facility and was brought to emergency room  #1. Acute Hypernatremia in patient with recent diarrhea -Continue aggressive IV fluids, , following sodium level frequently -156--- 157--- 154 -Monitor I's and O's  #2. Elevated transaminase level secondary to gallstones -Patient appears asymptomatic LFTs are improving. Discussed at length with patient's 2 daughters in the room. They do not want any aggressive measures as long as patient is asymptomatic.  -#3 metabolic encephalopathy due to dehydration -Patient eating well.  #4. Elevated troponin, likely demand ischemia, aspirin therapy  #5. Leukocytosis, concerning for  intra-abdominal infection, initiate patient on levofloxacin  Discussed with patient's daughter in the room Case discussed with Care Management/Social Worker. Management plans discussed with the patient, family and they are in agreement.  CODE STATUS: DO NOT RESUSCITATE  DVT Prophylaxis: Lovenox  TOTAL TIME TAKING CARE OF THIS PATIENT: 30 minutes.  >50% time spent on counselling and coordination of care  POSSIBLE D/C IN one to 2 DAYS, DEPENDING ON CLINICAL CONDITION.  Note: This dictation was prepared with Dragon dictation along with smaller phrase technology. Any transcriptional errors that result from this process are unintentional.  Alexandr Oehler M.D on 03/09/2016 at 1:30 PM  Between 7am to 6pm - Pager - [phone]  After 6pm go to [email or web address] - password [email or web address] [facility]  [place] [facility]  Office  [phone]  CC: Primary care physician; PROVIDER NOT IN SYSTEM

## 2016-03-10 LAB — CBC
HEMATOCRIT: 32.1 % — AB (ref 35.0–47.0)
HEMOGLOBIN: 10.8 g/dL — AB (ref 12.0–16.0)
MCH: 31.3 pg (ref 26.0–34.0)
MCHC: 33.7 g/dL (ref 32.0–36.0)
MCV: 92.8 fL (ref 80.0–100.0)
Platelets: 263 10*3/uL (ref 150–440)
RBC: 3.46 MIL/uL — ABNORMAL LOW (ref 3.80–5.20)
RDW: 13.1 % (ref 11.5–14.5)
WBC: 11.1 10*3/uL — AB (ref 3.6–11.0)

## 2016-03-10 LAB — BASIC METABOLIC PANEL
Anion gap: 5 (ref 5–15)
BUN: 31 mg/dL — AB (ref 6–20)
CHLORIDE: 113 mmol/L — AB (ref 101–111)
CO2: 23 mmol/L (ref 22–32)
Calcium: 8.3 mg/dL — ABNORMAL LOW (ref 8.9–10.3)
Creatinine, Ser: 0.74 mg/dL (ref 0.44–1.00)
GFR calc Af Amer: >60 mL/min (ref >60)
GFR calc non Af Amer: >60 mL/min (ref >60)
GLUCOSE: 109 mg/dL — AB (ref 65–99)
POTASSIUM: 3.4 mmol/L — AB (ref 3.5–5.1)
Sodium: 141 mmol/L (ref 135–145)

## 2016-03-10 MED ORDER — ENOXAPARIN SODIUM 40 MG/0.4ML ~~LOC~~ SOLN: 40.0000 mg | Freq: Every day | SUBCUTANEOUS | Status: DC

## 2016-03-10 MED ORDER — LEVOFLOXACIN 250 MG PO TABS: 250.0000 mg | ORAL_TABLET | ORAL | Status: DC

## 2016-03-10 NOTE — Progress Notes (Signed)
PHARMACIST - PHYSICIAN COMMUNICATION DR:   Allena KatzPatel CONCERNING: Antibiotic IV to Oral Route Change Policy  RECOMMENDATION: This patient is receiving azithromycin by the intravenous route.  Based on criteria approved by the Pharmacy and Therapeutics Committee, the antibiotic(s) is/are being converted to the equivalent oral dose form(s).   DESCRIPTION: These criteria include:  Patient being treated for a respiratory tract infection, urinary tract infection, cellulitis or clostridium difficile associated diarrhea if on metronidazole  The patient is not neutropenic and does not exhibit a GI malabsorption state  The patient is eating (either orally or via tube) and/or has been taking other orally administered medications for a least 24 hours  The patient is improving clinically and has a Tmax < 100.5  If you have questions about this conversion, please contact the Pharmacy Department   Cindi CarbonMary M Cayman Kielbasa, PharmD Clinical Pharmacist 03/10/16 10:33 AM

## 2016-03-10 NOTE — Discharge Summary (Signed)
[facility] - [place] at [facility]   PATIENT NAME: Amber Powers    MR#:  [id number]  DATE OF BIRTH:  08-Jun-1919  DATE OF ADMISSION:  03/08/2016 ADMITTING PHYSICIAN: Amber Caperima Vaickute, MD  DATE OF DISCHARGE: 03/10/16  PRIMARY CARE PHYSICIAN: PROVIDER NOT IN SYSTEM    ADMISSION DIAGNOSIS:  Hypernatremia [E87.0] Failure to thrive in adult [R62.7] Elevated transaminase level [R74.0] Altered mental status, unspecified altered mental status type [R41.82]  DISCHARGE DIAGNOSIS:  Acute hypernatremia due to poor po intake-resolved Advance Dementia Gall stones on USG-Incidental SECONDARY DIAGNOSIS:   Past Medical History:  Diagnosis Date  . Dementia   . Hypothyroid     HOSPITAL COURSE:  MildredThompsonis a 83 y.o.femalewith a known history of Dementia, hypothyroidism, hypertension, who presented to the hospital with complaints of wheezing. According to patient's daughters, she was noted to have wheezing in the facility and was brought to emergency room  #1. Acute Hypernatremia in patient with recent diarrhea -pt received  aggressive IV fluids-156--- 157--- 154--141 -Monitor I's and O's  #2. Elevated transaminase level secondary to gallstones -Patient appears asymptomatic LFTs are improving. Discussed at length with patient's 2 daughters in the room. They do not want any aggressive measures as long as patient is asymptomatic.  -#3 metabolic encephalopathy due to dehydration -Patient eating well.  #4. Elevated troponin, likely demand ischemia, aspirin therapy  #5. Leukocytosis resolved no source of infeciton noted Has Gall stones on USG abdomen but pt is asymptomatic D/c levaquin Wbc 11K  Discussed with patient's daughter in the room. She would like to find out if pt is able to get hospice services CSW to see pt   D/c to [facility] today.  Case discussed with Care Management/Social Worker. Management plans discussed with the patient,  family and they are in agreement.  CODE STATUS: DO NOT RESUSCITATE  DVT Prophylaxis: Lovenox   CONSULTS OBTAINED:    DRUG ALLERGIES:  No Known Allergies  DISCHARGE MEDICATIONS:   Current Discharge Medication List    CONTINUE these medications which have NOT CHANGED   Details  acetaminophen (TYLENOL) 500 MG tablet Take 500 mg by mouth 3 (three) times daily. For Fever up to 101, for headache, for pain     alum & mag hydroxide-simeth (MAALOX/MYLANTA) 200-200-20 MG/5ML suspension Take 30 mLs by mouth every 6 (six) hours as needed for indigestion or heartburn.    busPIRone (BUSPAR) 5 MG tablet Take 5 mg by mouth 2 (two) times daily.    Cholecalciferol (VITAMIN D) 2000 units CAPS Take 2,000 Units by mouth daily.    docusate (COLACE) 50 MG/5ML liquid Take 100 mg by mouth at bedtime as needed for mild constipation.    HYDROcodone-acetaminophen (NORCO/VICODIN) 5-325 MG per tablet Take 1 tablet by mouth 2 (two) times daily.     levothyroxine (SYNTHROID, LEVOTHROID) 75 MCG tablet Take 75 mcg by mouth daily before breakfast.    magic mouthwash SOLN Take 15 mLs by mouth.    Neomycin-Bacitracin-Polymyxin (TRIPLE ANTIBIOTIC) [id number] OINT Apply topically 4 (four) times daily as needed. For skin tear, abrasions, or minor irritations    ondansetron (ZOFRAN) 4 MG tablet Take 1 tablet (4 mg total) by mouth every 8 (eight) hours as needed for nausea or vomiting. Qty: 8 tablet, Refills: 0    Polyethylene Glycol 3350 GRAN 17 g by Does not apply route 2 (two) times daily.    sertraline (ZOLOFT) 50 MG tablet Take 50 mg by mouth daily.    vitamin B-12 (CYANOCOBALAMIN) 1000 MCG  tablet Take 1,000 mcg by mouth daily.      STOP taking these medications     amLODipine (NORVASC) 2.5 MG tablet      oseltamivir (TAMIFLU) 75 MG capsule         If you experience worsening of your admission symptoms, develop shortness of breath, life threatening emergency, suicidal or homicidal thoughts  you must seek medical attention immediately by calling 911 or calling your MD immediately  if symptoms less severe.  You Must read complete instructions/literature along with all the possible adverse reactions/side effects for all the Medicines you take and that have been prescribed to you. Take any new Medicines after you have completely understood and accept all the possible adverse reactions/side effects.   Please note  You were cared for by a hospitalist during your hospital stay. If you have any questions about your discharge medications or the care you received while you were in the hospital after you are discharged, you can call the unit and asked to speak with the hospitalist on call if the hospitalist that took care of you is not available. Once you are discharged, your primary care physician will handle any further medical issues. Please note that NO REFILLS for any discharge medications will be authorized once you are discharged, as it is imperative that you return to your primary care physician (or establish a relationship with a primary care physician if you do not have one) for your aftercare needs so that they can reassess your need for medications and monitor your lab values. Today   SUBJECTIVE   Eating well. Sleepy but response to daughter's commands. No fever   VITAL SIGNS:  Blood pressure 94/66, pulse 71, temperature 98.3 F (36.8 C), temperature source Axillary, resp. rate 18, height 5\' 2"  (1.575 m), weight 58.1 kg (128 lb 1.6 oz), SpO2 96 %.  I/O:    Intake/Output Summary (Last 24 hours) at 03/10/16 1519 Last data filed at 03/10/16 0959  Gross per 24 hour  Intake             2318 ml  Output                0 ml  Net             2318 ml    PHYSICAL EXAMINATION:  GENERAL:  83 y.o.-year-old patient lying in the bed with no acute distress.  EYES: Pupils equal, round, reactive to light and accommodation. No scleral icterus. Extraocular muscles intact.  HEENT: Head  atraumatic, normocephalic. Oropharynx and nasopharynx clear.  NECK:  Supple, no jugular venous distention. No thyroid enlargement, no tenderness.  LUNGS: Normal breath sounds bilaterally, no wheezing, rales,rhonchi or crepitation. No use of accessory muscles of respiration.  CARDIOVASCULAR: S1, S2 normal. No murmurs, rubs, or gallops.  ABDOMEN: Soft, non-tender, non-distended. Bowel sounds present. No organomegaly or mass.  EXTREMITIES: No pedal edema, cyanosis, or clubbing.  NEUROLOGIC: unable to assess due to dementia  PSYCHIATRIC: sleepy SKIN: No obvious rash, lesion, or ulcer.   DATA REVIEW:   CBC   Recent Labs Lab 03/10/16 0553  WBC 11.1*  HGB 10.8*  HCT 32.1*  PLT 263    Chemistries   Recent Labs Lab 03/09/16 0601  03/10/16 0553  NA 157*  < > 141  K 3.3*  --  3.4*  CL 121*  --  113*  CO2 28  --  23  GLUCOSE 148*  --  109*  BUN 48*  --  31*  CREATININE 1.04*  --  0.74  CALCIUM 9.2  --  8.3*  AST 75*  --   --   ALT 60*  --   --   ALKPHOS 87  --   --   BILITOT 0.5  --   --   < > = values in this interval not displayed.  Microbiology Results   No results found for this or any previous visit (from the past 240 hour(s)).  RADIOLOGY:  Dg Chest 2 View  Result Date: 03/08/2016 CLINICAL DATA:  Progressive shortness of breath today. EXAM: CHEST  2 VIEW COMPARISON:  02/26/2016 FINDINGS: The heart size and mediastinal contours are stable. Aortic atherosclerosis. Mild right mid and lower lung scarring. No evidence of pulmonary infiltrate or edema. No evidence of pneumothorax or pleural effusion. Small hiatal hernia again noted. IMPRESSION: No active cardiopulmonary disease. Small hiatal hernia. Aortic atherosclerosis. Electronically Signed   By: Myles Rosenthal M.D.   On: 03/08/2016 19:09   Ct Head Wo Contrast  Result Date: 03/08/2016 CLINICAL DATA:  Altered loc today per family, not as responsive, somnolent EXAM: CT HEAD WITHOUT CONTRAST TECHNIQUE: Contiguous axial images  were obtained from the base of the skull through the vertex without intravenous contrast. COMPARISON:  09/30/2015 FINDINGS: Brain: No acute intracranial hemorrhage. No focal mass lesion. No CT evidence of acute infarction. No midline shift or mass effect. No hydrocephalus. Basilar cisterns are patent. Extensive cortical atrophy and proportional ventricular dilatation. Periventricular white matter hypodensities. Vascular: No hyperdense vessel or unexpected calcification. Skull: Normal. Negative for fracture or focal lesion. Sinuses/Orbits: Paranasal sinuses and mastoid air cells are clear. Orbits are clear. Other: None. IMPRESSION: 1. No acute intracranial findings.  No change from prior. 2. Atrophy and ventricular dilatation unchanged. Electronically Signed   By: Genevive Bi M.D.   On: 03/08/2016 20:10   US Abdomen Limited Ruq  Result Date: 03/09/2016 CLINICAL DATA:  Elevated LFTs EXAM: US ABDOMEN LIMITED - RIGHT UPPER QUADRANT COMPARISON:  None. FINDINGS: Gallbladder: Cholelithiasis is identified with no Murphy's sign, pericholecystic fluid, or wall thickening. A small amount of sludge is identified as well. Common bile duct: Diameter: 5.8 mm Liver: No focal lesion identified. Within normal limits in parenchymal echogenicity. IMPRESSION: 1. Cholelithiasis and sludge with no wall thickening, pericholecystic fluid, or Murphy's sign. 2. 1.9 cm right renal cyst. Electronically Signed   By: Gerome Sam III M.D   On: 03/09/2016 10:11     Management plans discussed with the patient, family and they are in agreement.  CODE STATUS:     Code Status Orders        Start     Ordered   03/08/16 2231  Do not attempt resuscitation (DNR)  Continuous    Question Answer Comment  In the event of cardiac or respiratory ARREST Do not call a "code blue"   In the event of cardiac or respiratory ARREST Do not perform Intubation, CPR, defibrillation or ACLS   In the event of cardiac or respiratory ARREST Use  medication by any route, position, wound care, and other measures to relive pain and suffering. May use oxygen, suction and manual treatment of airway obstruction as needed for comfort.      03/08/16 2230    Code Status History    Date Active Date Inactive Code Status Order ID Comments User Context   06/17/2014 10:37 AM 06/18/2014  4:41 PM DNR [id number]  Athena Masse, RN Inpatient    Advance Directive Documentation   Flowsheet Row  Most Recent Value  Type of Advance Directive  Healthcare Power of [place], Out of facility DNR (pink MOST or yellow form) [Daughter, Dois Davenport Cauthren]  Pre-existing out of facility DNR order (yellow form or pink MOST form)  Yellow form placed in chart (order not valid for inpatient use), Physician notified to receive inpatient order  "MOST" Form in Place?  No data      TOTAL TIME TAKING CARE OF THIS PATIENT: 40 minutes.    Himmat Enberg M.D on 03/10/2016 at 3:19 PM  Between 7am to 6pm - Pager - [phone] After 6pm go to [email or web address] - password [email or web address] [facility]  [facility]  Office  [phone]  CC: Primary care physician; PROVIDER NOT IN SYSTEM

## 2016-03-10 NOTE — Progress Notes (Signed)
Order for enoxaparin 30 mg daily changed to 40 mg dose per anticoagulation protocol for CrCl > 30 mL/min.  Cindi CarbonMary M Tane Biegler, PharmD Clinical Pharmacist 03/10/16 10:40 AM

## 2016-03-10 NOTE — Clinical Social Work Note (Signed)
Clinical Social Work Assessment  Patient Details  Name: Amber Powers MRN: [id number] Date of Birth: 1919/04/20  Date of referral:  03/10/16               Reason for consult:  Facility Placement                Permission sought to share information with:    Permission granted to share information::   (patient sleeping soundly)  Name::        Agency::     Relationship::     Contact Information:     Housing/Transportation Living arrangements for the past 2 months:  [place] of Information:  Adult Children, Facility Patient Interpreter Needed:  None Criminal Activity/Legal Involvement Pertinent to Current Situation/Hospitalization:  No - Comment as needed Significant Relationships:  Adult Children Lives with:  Facility Resident Do you feel safe going back to the place where you live?  Yes Need for family participation in patient care:  Yes (Comment)  Care giving concerns:  Patient resides at [facility].   Social Worker assessment / plan:  MD is ready to discharge patient back to [facility]. CSW met with patient's daughter who was in patient's room. Patient's daughter stated that at baseline patient is wheelchair bound. Patient was sleeping soundly and would not awaken for patient's daughter. CSW contacted Olivia Mackie at [facility] and informed her that patient is not alert currently and has been sleeping a great deal. CSW asked if knowing that, if they could accept patient back. Olivia Mackie stated as long as patient was stable, they would take her back with no concern about mobility/alertness. CSW inquired if Palliative Care could follow patient there and Olivia Mackie stated that would be a good idea.   CSW had expressed concern to patient's daughter about whether or not [facility] was going to accept patient due to her lethargy. CSW called and spoke with Olivia Mackie and after getting confirmation that [facility] would take patient back, CSW informed  patient's daughter.   Employment status:  Retired Nurse, adult PT Recommendations:  Not assessed at this time Information / Referral to community resources:     Patient/Family's Response to care:  Patient's daughter expressed appreciation for CSW assistance.  Patient/Family's Understanding of and Emotional Response to Diagnosis, Current Treatment, and Prognosis:  Patient's daughter expressed concern that patient may need hospice services at [facility]. CSW informed her that the attending was not yet ready to say she needed hospice but that the physician that follows her at the facility could possibly order it if needed. CSW will have Palliative Care follow patient at the facility.   Emotional Assessment Appearance:  Appears stated age Attitude/Demeanor/Rapport:  Unable to Assess Affect (typically observed):  Unable to Assess Orientation:  Fluctuating Orientation (Suspected and/or reported Sundowners) Alcohol / Substance use:  Not Applicable Psych involvement (Current and /or in the community):  No (Comment)  Discharge Needs  Concerns to be addressed:  Care Coordination Readmission within the last 30 days:  No Current discharge risk:  None Barriers to Discharge:  No Barriers Identified   Shela Leff, LCSW 03/10/2016, 3:44 PM

## 2016-03-10 NOTE — Progress Notes (Signed)
Patient discharged with EMS to [facility].  Daughter at bedside.

## 2016-03-10 NOTE — Care Management Important Message (Signed)
Important Message  Patient Details  Name: Amber Powers MRN: [id number] Date of Birth: 1919/10/18   Medicare Important Message Given:  N/A - LOS <3 / Initial given by admissions    Chapman FitchBOWEN, Shanecia Hoganson T, RN 03/10/2016, 3:28 PM

## 2016-03-10 NOTE — NC FL2 (Signed)
[place] MEDICAID FL2 LEVEL OF CARE SCREENING TOOL     IDENTIFICATION  Patient Name: Amber Powers Birthdate: Dec 23, 1919 Sex: female Admission Date (Current Location): 03/08/2016  [place]   Facility and Address:  [facility], [address], [place], [place] [address]      Provider Number: [id number]  Attending Physician Name and Address:  Enedina FinnerSona Patel, MD  Relative Name and Phone Number:       Current Level of Care: Hospital Recommended Level of Care: Assisted Living Facility Prior Approval Number:    Date Approved/Denied:   PASRR Number:    Discharge Plan:  (ALF)    Current Diagnoses: Patient Active Problem List   Diagnosis Date Noted  . Hypernatremia 03/08/2016  . Metabolic encephalopathy 03/08/2016  . Elevated transaminase level 03/08/2016  . Elevated troponin 03/08/2016  . Leukocytosis 03/08/2016  . Altered mental state 06/18/2014    Orientation RESPIRATION BLADDER Height & Weight     Self  Normal Incontinent Weight: 128 lb 1.6 oz (58.1 kg) Height:  5\' 2"  (157.5 cm)  BEHAVIORAL SYMPTOMS/MOOD NEUROLOGICAL BOWEL NUTRITION STATUS   (none)  (none) Incontinent Diet (dysphagia 1)  AMBULATORY STATUS COMMUNICATION OF NEEDS Skin     Verbally Normal                       Personal Care Assistance Level of Assistance  Bathing, Dressing, Feeding Bathing Assistance: Limited assistance Feeding assistance: Limited assistance Dressing Assistance: Limited assistance     Functional Limitations Info  Sight Sight Info: Impaired        SPECIAL CARE FACTORS FREQUENCY                       Contractures Contractures Info: Not present    Additional Factors Info  Code Status, Allergies Code Status Info: dnr Allergies Info: nka           Current Discharge Medication List        CONTINUE these medications which have NOT CHANGED   Details  acetaminophen (TYLENOL) 500 MG tablet Take  500 mg by mouth 3 (three) times daily. For Fever up to 101, for headache, for pain     alum & mag hydroxide-simeth (MAALOX/MYLANTA) 200-200-20 MG/5ML suspension Take 30 mLs by mouth every 6 (six) hours as needed for indigestion or heartburn.    busPIRone (BUSPAR) 5 MG tablet Take 5 mg by mouth 2 (two) times daily.    Cholecalciferol (VITAMIN D) 2000 units CAPS Take 2,000 Units by mouth daily.    docusate (COLACE) 50 MG/5ML liquid Take 100 mg by mouth at bedtime as needed for mild constipation.    HYDROcodone-acetaminophen (NORCO/VICODIN) 5-325 MG per tablet Take 1 tablet by mouth 2 (two) times daily.     levothyroxine (SYNTHROID, LEVOTHROID) 75 MCG tablet Take 75 mcg by mouth daily before breakfast.    magic mouthwash SOLN Take 15 mLs by mouth.    Neomycin-Bacitracin-Polymyxin (TRIPLE ANTIBIOTIC) [phone] OINT Apply topically 4 (four) times daily as needed. For skin tear, abrasions, or minor irritations    ondansetron (ZOFRAN) 4 MG tablet Take 1 tablet (4 mg total) by mouth every 8 (eight) hours as needed for nausea or vomiting. Qty: 8 tablet, Refills: 0    Polyethylene Glycol 3350 GRAN 17 g by Does not apply route 2 (two) times daily.    sertraline (ZOLOFT) 50 MG tablet Take 50 mg by mouth daily.    vitamin  B-12 (CYANOCOBALAMIN) 1000 MCG tablet Take 1,000 mcg by mouth daily.         STOP taking these medications     amLODipine (NORVASC) 2.5 MG tablet      oseltamivir (TAMIFLU) 75 MG capsule     York Spaniel, LCSW

## 2016-03-10 NOTE — Progress Notes (Signed)
SLP Cancellation Note  Patient Details Name: Dustin FlockMildred C Powers MRN: [id number] DOB: Jul 07, 1919   Cancelled treatment:       Reason Eval/Treat Not Completed: SLP screened, no needs identified, will sign off (reviewed chart notes; consulted NSG then family member). After lengthy discussion w/ NSG and family member, pt appears to be successfully tolerating the ordered Pureed diet (Dysphagia level 1) w/ thin liquids when given support w/ feeding and when following aspiration precautions(including small bites/sips, sitting fully upright w/ all oral intake). Pt has been eating the Puree foods at lunch meal w/ no oral phase deficits and swallowing her Pills CRUSHED in Puree by NSG w/ no deficits.  Recommend continue a Dysphagia level 1(PUREE) diet w/ Thin liquids w/ aspiration precautions and feeding support. Pt appears at her baseline w/ regard to swallowing per family and NSG report. NSG to reconsult if any change in status while admitted. Family/NSG agreed.    Jerilynn SomKatherine Cathe Bilger, MS, CCC-SLP Amber Powers 03/10/2016, 5:38 PM

## 2016-03-10 NOTE — Care Management (Signed)
Palliative care referral made to [place] at [facility]

## 2016-03-10 NOTE — Progress Notes (Signed)
Gave report to Merry ProudBrandi, Charity fundraiserN at [facility].  Called EMS for transport- waiting for EMS to arrive.

## 2016-03-10 NOTE — Discharge Instructions (Signed)
F/u with PCP.  

## 2017-03-28 ENCOUNTER — Encounter: Payer: Self-pay | Admitting: Emergency Medicine

## 2017-03-28 ENCOUNTER — Other Ambulatory Visit: Payer: Self-pay

## 2017-03-28 ENCOUNTER — Emergency Department

## 2017-03-28 ENCOUNTER — Emergency Department
Admission: EM | Admit: 2017-03-28 | Discharge: 2017-03-28 | Disposition: A | Discharge status: Home / Self Care | Attending: Emergency Medicine | Admitting: Emergency Medicine

## 2017-03-28 DIAGNOSIS — W050XXA Fall from non-moving wheelchair, initial encounter: Secondary | ICD-10-CM | POA: Diagnosis not present

## 2017-03-28 DIAGNOSIS — F0390 Unspecified dementia without behavioral disturbance: Secondary | ICD-10-CM | POA: Insufficient documentation

## 2017-03-28 DIAGNOSIS — Z23 Encounter for immunization: Secondary | ICD-10-CM | POA: Diagnosis not present

## 2017-03-28 DIAGNOSIS — R062 Wheezing: Secondary | ICD-10-CM | POA: Insufficient documentation

## 2017-03-28 DIAGNOSIS — Y9389 Activity, other specified: Secondary | ICD-10-CM | POA: Insufficient documentation

## 2017-03-28 DIAGNOSIS — E039 Hypothyroidism, unspecified: Secondary | ICD-10-CM | POA: Insufficient documentation

## 2017-03-28 DIAGNOSIS — Z79899 Other long term (current) drug therapy: Secondary | ICD-10-CM | POA: Diagnosis not present

## 2017-03-28 DIAGNOSIS — S0990XA Unspecified injury of head, initial encounter: Secondary | ICD-10-CM | POA: Diagnosis present

## 2017-03-28 DIAGNOSIS — S0081XA Abrasion of other part of head, initial encounter: Secondary | ICD-10-CM | POA: Insufficient documentation

## 2017-03-28 DIAGNOSIS — Y998 Other external cause status: Secondary | ICD-10-CM | POA: Insufficient documentation

## 2017-03-28 DIAGNOSIS — S0083XA Contusion of other part of head, initial encounter: Secondary | ICD-10-CM | POA: Diagnosis not present

## 2017-03-28 DIAGNOSIS — Y92129 Unspecified place in nursing home as the place of occurrence of the external cause: Secondary | ICD-10-CM | POA: Diagnosis not present

## 2017-03-28 DIAGNOSIS — T148XXA Other injury of unspecified body region, initial encounter: Secondary | ICD-10-CM

## 2017-03-28 MED ORDER — IPRATROPIUM-ALBUTEROL 0.5-2.5 (3) MG/3ML IN SOLN
3.0000 mL | Freq: Once | RESPIRATORY_TRACT | Status: AC
  Administered 2017-03-28: 3 mL via RESPIRATORY_TRACT
  Filled 2017-03-28: qty 3

## 2017-03-28 MED ORDER — IPRATROPIUM-ALBUTEROL 0.5-2.5 (3) MG/3ML IN SOLN: 3.0000 mL | RESPIRATORY_TRACT | 0 refills | Status: AC | PRN

## 2017-03-28 MED ORDER — BACITRACIN ZINC 500 UNIT/GM EX OINT
TOPICAL_OINTMENT | Freq: Two times a day (BID) | CUTANEOUS | Status: DC
  Administered 2017-03-28: 15:00:00 via TOPICAL

## 2017-03-28 MED ORDER — BACITRACIN ZINC 500 UNIT/GM EX OINT
TOPICAL_OINTMENT | CUTANEOUS | Status: AC
  Filled 2017-03-28: qty 0.9

## 2017-03-28 MED ORDER — ACETAMINOPHEN 160 MG/5ML PO SUSP
ORAL | Status: AC
  Filled 2017-03-28: qty 25

## 2017-03-28 MED ORDER — TETANUS-DIPHTH-ACELL PERTUSSIS 5-2.5-18.5 LF-MCG/0.5 IM SUSP
0.5000 mL | Freq: Once | INTRAMUSCULAR | Status: AC
  Administered 2017-03-28: 0.5 mL via INTRAMUSCULAR
  Filled 2017-03-28: qty 0.5

## 2017-03-28 MED ORDER — ACETAMINOPHEN 160 MG/5ML PO SOLN
650.0000 mg | Freq: Once | ORAL | Status: AC
  Administered 2017-03-28: 650 mg via ORAL
  Filled 2017-03-28: qty 20.3

## 2017-03-28 NOTE — ED Provider Notes (Signed)
[facility] Emergency Department Provider Note ____________________________________________   I have reviewed the triage vital signs and the triage nursing note.  HISTORY  Chief Complaint Fall   Historian Level 5 Caveat History Limited by patient poor historian History by EMS report from nursing home and also daughter at the bedside who provides baseline.  HPI Amber Powers is a 84 y.o. female with dementia from a nursing home, apparently fell forward out of her wheelchair when she fell asleep and struck her head.  Not on a blood thinner.  She does have a hematoma and skin break/crush to the left forehead.  No additional complaints although patient is a poor historian.  Daughter states that she has been here in her usual state of health and is not noticing a change in mental status or any recent illness or concern for dehydration.     Past Medical History:  Diagnosis Date  . Dementia   . Hypothyroid     Patient Active Problem List   Diagnosis Date Noted  . Hypernatremia 03/08/2016  . Metabolic encephalopathy 03/08/2016  . Elevated transaminase level 03/08/2016  . Elevated troponin 03/08/2016  . Leukocytosis 03/08/2016  . Altered mental state 06/18/2014    History reviewed. No pertinent surgical history.  Prior to Admission medications   Medication Sig Start Date End Date Taking? Authorizing Provider  acetaminophen (TYLENOL) 500 MG tablet Take 500 mg by mouth 3 (three) times daily. For Fever up to 101, for headache, for pain    Yes [provider]  alum & mag hydroxide-simeth (MAALOX/MYLANTA) 200-200-20 MG/5ML suspension Take 30 mLs by mouth every 6 (six) hours as needed for indigestion or heartburn.   Yes [provider]  busPIRone (BUSPAR) 5 MG tablet Take 5 mg by mouth 2 (two) times daily.   Yes [provider]  Cholecalciferol (VITAMIN D) 2000 units CAPS Take 2,000 Units by mouth daily.   Yes [provider]  docusate (COLACE) 50 MG/5ML liquid Take 100 mg by mouth 2 (two) times daily.    Yes [provider]  guaiFENesin (ROBITUSSIN) 100 MG/5ML SOLN Take 10 mLs by mouth every 6 (six) hours as needed for cough or to loosen phlegm.   Yes [provider]  levothyroxine (SYNTHROID, LEVOTHROID) 75 MCG tablet Take 75 mcg by mouth daily before breakfast.   Yes [provider]  loperamide (IMODIUM) 2 MG capsule Take 2 mg by mouth as needed for diarrhea or loose stools (Max 8 doses per 24 hours).   Yes [provider]  magnesium hydroxide (MILK OF MAGNESIA) 400 MG/5ML suspension Take 30 mLs by mouth at bedtime as needed for mild constipation.   Yes [provider]  Neomycin-Bacitracin-Polymyxin (TRIPLE ANTIBIOTIC) [phone] OINT Apply topically 4 (four) times daily as needed. For skin tear, abrasions, or minor irritations   Yes [provider]  ondansetron (ZOFRAN) 4 MG tablet Take 1 tablet (4 mg total) by mouth every 8 (eight) hours as needed for nausea or vomiting. 02/26/16  Yes Jeanmarie Plant, MD  Polyethylene Glycol 3350 GRAN 17 g by Does not apply route 2 (two) times daily.   Yes [provider]  vitamin B-12 (CYANOCOBALAMIN) 1000 MCG tablet Take 1,000 mcg by mouth daily.   Yes [provider]  Zinc Oxide 13 % CREA Apply 1 application topically as directed. After each bathroom change   Yes [provider]  ipratropium-albuterol (DUONEB) 0.5-2.5 (3) MG/3ML SOLN Take 3 mLs by nebulization every 4 (four)  hours as needed (For wheezing). 03/28/17   Governor Rooks, MD    No Known Allergies  History reviewed. No pertinent family history.  Social History Social History   Tobacco Use  . Smoking status: Never Smoker  . Smokeless tobacco: Never Used  Substance Use Topics  . Alcohol use: No  . Drug use: No    Review of Systems  Constitutional: Negative for fever. Eyes: Negative for visual changes. ENT:  Negative for sore throat. Cardiovascular: Negative for chest pain. Respiratory: Negative for shortness of breath.  Mild congestion/mild cough without productive of sputum. Gastrointestinal: Negative for abdominal pain, vomiting and diarrhea. Genitourinary: Negative for dysuria. Musculoskeletal: Negative for back pain. Skin: Negative for rash. Neurological: Negative for headache.  ____________________________________________   PHYSICAL EXAM:  VITAL SIGNS: ED Triage Vitals  Enc Vitals Group     BP 03/28/17 1143 140/62     Pulse Rate 03/28/17 1143 60     Resp 03/28/17 1143 18     Temp 03/28/17 1143 97.6 F (36.4 C)     Temp Source 03/28/17 1143 Axillary     SpO2 03/28/17 1143 97 %     Weight 03/28/17 1144 130 lb (59 kg)     Height 03/28/17 1144 5\' 2"  (1.575 m)     Head Circumference --      Peak Flow --      Pain Score --      Pain Loc --      Pain Edu? --      Excl. in GC? --      Constitutional: Alert and operative, poor historian. Well appearing and in no distress. HEENT   Head: Normocephalic and atraumatic.      Eyes: Conjunctivae are normal. Pupils equal and round.       Ears:         Nose: No congestion/rhinnorhea.   Mouth/Throat: Mucous membranes are moist.   Neck: No stridor.  Kyphosis.  Seems to be mildly sore around the neck without necessarily point tenderness in the midline C-spine. Cardiovascular/Chest: Normal rate, regular rhythm.  No murmurs, rubs, or gallops. Respiratory: Normal respiratory effort without tachypnea nor retractions.  Mild end expiratory wheezing without rhonchi. Gastrointestinal: Soft. No distention, no guarding, no rebound. Nontender.    Genitourinary/rectal:Deferred Musculoskeletal: Pelvis stable and nontender.  I am able to range her hips without pain.  Nontender with normal range of motion in all extremities. No joint effusions.  No lower extremity tenderness.  No edema. Neurologic:  Normal speech and language. No gross or  focal neurologic deficits are appreciated. Skin:  Skin is warm, dry and intact. No rash noted. Psychiatric: Mood and affect are normal. Speech and behavior are normal. Patient exhibits appropriate insight and judgment.   ____________________________________________  LABS (pertinent positives/negatives) I, Governor Rooks, MD the attending physician have reviewed the labs noted below.  Labs Reviewed - No data to display  ____________________________________________    EKG I, Governor Rooks, MD, the attending physician have personally viewed and interpreted all ECGs.  None done ____________________________________________  RADIOLOGY All Xrays were viewed by me.  Imaging interpreted by Radiologist, and I, Governor Rooks, MD the attending physician have reviewed the radiologist interpretation noted below.  CT head without contrast:IMPRESSION: No acute intracranial abnormality.  Atrophy, chronic microvascular disease.  CT cervical spine without contrast:  IMPRESSION: 1. No cervical spine fracture. 2. Disc osteophytic disease lower cervical spine not changed from prior. 3. Multilevel facet hypertrophy  Chest x-ray two-view: No obvious infiltrate Radiologist report:FINDINGS:  Linear areas of scarring in the lung bases. Heart is borderline in size. No edema. No effusions or acute bony abnormality. Advanced degenerative changes in the shoulders.  IMPRESSION: Linear areas of scarring in the lung bases.  No active disease. __________________________________________  PROCEDURES  Procedure(s) performed: None  Critical Care performed: None   ____________________________________________  ED COURSE / ASSESSMENT AND PLAN  Pertinent labs & imaging results that were available during my care of the patient were reviewed by me and considered in my medical decision making (see chart for details).   Patient here for trauma evaluation after falling out of her wheelchair.  Daughter  does not feel like she has any altered mental status although she is wheezing and does not really have a history of that.  I suspect bronchitis.  Chest x-ray without obvious pneumonia.  I will prescribe albuterol as needed as needed for wheezing.  She was updated with her tetanus shot because they were unsure when her last tetanus booster was.    CONSULTATIONS:   None   Patient / Family / Caregiver informed of clinical course, medical decision-making process, and agree with plan.   I discussed return precautions, follow-up instructions, and discharge instructions with patient and/or family.  Discharge Instructions : No serious traumatic injury is suspected or found on exam.  Tetanus shot was updated in good for 5 years.  She is having some wheezing and so I have prescribed albuterol/ipratropium for use as needed for wheezing.  Return to the emergency room immediately for any worsening condition including fever, trouble breathing, confusion altered mental status, weakness, numbness, dizziness or passing out, or any other symptoms concerning to you.    ___________________________________________   FINAL CLINICAL IMPRESSION(S) / ED DIAGNOSES   Final diagnoses:  Contusion of face, initial encounter  Abrasion  Wheezing      ___________________________________________        Note: This dictation was prepared with Dragon dictation. Any transcriptional errors that result from this process are unintentional    Governor RooksLord, Aoi Kouns, MD 03/28/17 1332

## 2017-03-28 NOTE — Discharge Instructions (Signed)
No serious traumatic injury is suspected or found on exam.  Tetanus shot was updated in good for 5 years.  She is having some wheezing and so I have prescribed albuterol/ipratropium for use as needed for wheezing.  Return to the emergency room immediately for any worsening condition including fever, trouble breathing, confusion altered mental status, weakness, numbness, dizziness or passing out, or any other symptoms concerning to you.

## 2017-03-28 NOTE — ED Triage Notes (Signed)
Pt presents to ED via [facility] from [facility] c/o fall with head involvement. SNF staff told EMS pt was sitting up in wheelchair in front of nurse's station and fell asleep, falling forward out of wheelchair. Pt has advanced dementia and is unable to answer questions at baseline. Laceration noted to L forehead, bleeding controlled. Pt arrives clutching forehead with both hands.

## 2017-05-26 ENCOUNTER — Emergency Department

## 2017-05-26 ENCOUNTER — Emergency Department
Admission: EM | Admit: 2017-05-26 | Discharge: 2017-05-26 | Disposition: A | Discharge status: Home / Self Care | Attending: Emergency Medicine | Admitting: Emergency Medicine

## 2017-05-26 ENCOUNTER — Encounter: Payer: Self-pay | Admitting: Emergency Medicine

## 2017-05-26 ENCOUNTER — Other Ambulatory Visit: Payer: Self-pay

## 2017-05-26 DIAGNOSIS — R4182 Altered mental status, unspecified: Secondary | ICD-10-CM

## 2017-05-26 DIAGNOSIS — F0390 Unspecified dementia without behavioral disturbance: Secondary | ICD-10-CM | POA: Diagnosis not present

## 2017-05-26 DIAGNOSIS — E039 Hypothyroidism, unspecified: Secondary | ICD-10-CM | POA: Diagnosis not present

## 2017-05-26 DIAGNOSIS — Z79899 Other long term (current) drug therapy: Secondary | ICD-10-CM | POA: Insufficient documentation

## 2017-05-26 MED ORDER — AMOXICILLIN-POT CLAVULANATE 875-125 MG PO TABS: 1.0000 | ORAL_TABLET | Freq: Two times a day (BID) | ORAL | 0 refills | Status: AC

## 2017-05-26 MED ORDER — SODIUM CHLORIDE 0.9 % IV SOLN
1.0000 g | Freq: Once | INTRAVENOUS | Status: AC
  Administered 2017-05-26: 1 g via INTRAVENOUS
  Filled 2017-05-26: qty 10

## 2017-05-26 NOTE — ED Triage Notes (Signed)
Pt brought to ER via [facility] with complaints of LOC. Staff reports that she is able to say one word responses. Today she is repeating Hey hey and bobbing her head up and down so they sent her here to be evaluated.

## 2017-05-26 NOTE — Discharge Instructions (Signed)
since she has a low-grade fever we will try some Augmentin 1 pill twice a day. Please have her return at once if she worsens at all. Have her doctor check on her tomorrow.

## 2017-05-26 NOTE — ED Notes (Signed)
EMS here to take pt back to [facility] 

## 2017-05-26 NOTE — ED Notes (Signed)
Patient transported to CT 

## 2017-05-26 NOTE — ED Provider Notes (Addendum)
[facility] Emergency Department Provider Note   ____________________________________________   First MD Initiated Contact with Patient 05/26/17 1801     (approximate)  I have reviewed the triage vital signs and the nursing notes.   HISTORY  Chief Complaint Altered Mental Status  If complaint is altered mental status  History Limited by altered mental status  HPI Amber Powers is a 84 y.o. female who comes from [name] reportedly normal he says yes no cocaine and other short sentences. Today she is not doing any that she is just moaning and bobbing her head up and down. She has a low-grade fever 100.6. She is not coughing. She does not have foul-smelling urine. She is not having nausea vomiting diarrhea or abdominal pain.   Past Medical History:  Diagnosis Date  . Dementia   . Hypothyroid     Patient Active Problem List   Diagnosis Date Noted  . Hypernatremia 03/08/2016  . Metabolic encephalopathy 03/08/2016  . Elevated transaminase level 03/08/2016  . Elevated troponin 03/08/2016  . Leukocytosis 03/08/2016  . Altered mental state 06/18/2014    History reviewed. No pertinent surgical history.  Prior to Admission medications   Medication Sig Start Date End Date Taking? Authorizing Provider  acetaminophen (TYLENOL) 500 MG tablet Take 500 mg by mouth 3 (three) times daily. For Fever up to 101, for headache, for pain    Yes [provider]  alum & mag hydroxide-simeth (MAALOX/MYLANTA) 200-200-20 MG/5ML suspension Take 30 mLs by mouth every 6 (six) hours as needed for indigestion or heartburn.   Yes [provider]  busPIRone (BUSPAR) 5 MG tablet Take 5 mg by mouth 2 (two) times daily.   Yes [provider]  docusate (COLACE) 50 MG/5ML liquid Take 100 mg by mouth 2 (two) times daily.    Yes [provider]  ipratropium-albuterol (DUONEB) 0.5-2.5 (3) MG/3ML SOLN Take 3 mLs by nebulization every 4  (four) hours as needed (For wheezing). 03/28/17  Yes Governor Rooks, MD  levothyroxine (SYNTHROID, LEVOTHROID) 75 MCG tablet Take 75 mcg by mouth daily before breakfast.   Yes [provider]  loperamide (IMODIUM) 2 MG capsule Take 2 mg by mouth as needed for diarrhea or loose stools (Max 8 doses per 24 hours).   Yes [provider]  magnesium hydroxide (MILK OF MAGNESIA) 400 MG/5ML suspension Take 30 mLs by mouth at bedtime as needed for mild constipation.   Yes [provider]  Neomycin-Bacitracin-Polymyxin (TRIPLE ANTIBIOTIC) [phone] OINT Apply topically 4 (four) times daily as needed. For skin tear, abrasions, or minor irritations   Yes [provider]  ondansetron (ZOFRAN) 4 MG tablet Take 1 tablet (4 mg total) by mouth every 8 (eight) hours as needed for nausea or vomiting. 02/26/16  Yes Jeanmarie Plant, MD  Polyethylene Glycol 3350 GRAN Take 17 g by mouth daily as needed.    Yes [provider]  vitamin B-12 (CYANOCOBALAMIN) 1000 MCG tablet Take 1,000 mcg by mouth daily.   Yes [provider]  Zinc Oxide 13 % CREA Apply 1 application topically as directed. After each bathroom change   Yes [provider]  amoxicillin-clavulanate (AUGMENTIN) 875-125 MG tablet Take 1 tablet by mouth 2 (two) times daily for 10 days. 05/26/17 06/05/17  Arnaldo Natal, MD    Allergies Patient has no known allergies.  History reviewed. No pertinent family history.  Social History Social History   Tobacco Use  . Smoking status: Never Smoker  .  Smokeless tobacco: Never Used  Substance Use Topics  . Alcohol use: No  . Drug use: No    Review of Systems unable to obtain ____________________________________________   PHYSICAL EXAM:  VITAL SIGNS: ED Triage Vitals  Enc Vitals Group     BP      Pulse      Resp      Temp      Temp src      SpO2      Weight      Height      Head Circumference      Peak Flow      Pain Score      Pain  Loc      Pain Edu?      Excl. in GC?     Constitutional: Alert . in no acute distress. Eyes: Conjunctivae are normal. PERRL. EOMI. Head: Atraumatic. Nose: No congestion/rhinnorhea. Mouth/Throat: Mucous membranes are moist.  difficult to see all the way in the back of the mouth is patient is not opening wide. Neck: No stridor.   Cardiovascular: Normal rate, regular rhythm. Grossly normal heart sounds.  Good peripheral circulation. Respiratory: Normal respiratory effort.  No retractions. Lungs CTAB. Gastrointestinal: Soft and nontender. No distention. No abdominal bruits. No CVA tenderness. Musculoskeletal: No lower extremity tenderness nor edema.  No joint effusions. Neurologic:  Normal speech and language. No gross focal neurologic deficits are appreciated. No gait instability. Skin:  Skin is warm, dry and intact. No rash noted.   ____________________________________________   LABS (all labs ordered are listed, but only abnormal results are displayed)  Labs Reviewed  URINE CULTURE  CULTURE, BLOOD (ROUTINE X 2)  CULTURE, BLOOD (ROUTINE X 2)  COMPREHENSIVE METABOLIC PANEL  LACTIC ACID, PLASMA  LACTIC ACID, PLASMA  CBC WITH DIFFERENTIAL/PLATELET  URINALYSIS, COMPLETE (UACMP) WITH MICROSCOPIC  URINALYSIS, COMPLETE (UACMP) WITH MICROSCOPIC   ____________________________________________  EKG  EKG read interpreted by me shows normal sinus rhythm rate of 76 normal axis prolonged PR interval no acute ST-T wave changes ____________________________________________  RADIOLOGY  ED MD interpretation:  chest x-ray and head CT read by radiology as no acute disease I did review the chest x-ray  Official radiology report(s): Ct Head Wo Contrast  Result Date: 05/26/2017 CLINICAL DATA:  [name] asleep and fell out of wheelchair at care facility. History of severe dementia. EXAM: CT HEAD WITHOUT CONTRAST TECHNIQUE: Contiguous axial images were obtained from the base of the skull through the  vertex without intravenous contrast. COMPARISON:  CT HEAD March 28, 2017 FINDINGS: BRAIN: No intraparenchymal hemorrhage, mass effect nor midline shift. Severe temporal lobe atrophy with ex vacuo dilatation LEFT temporal horns. Moderate to severe general parenchymal brain volume loss. Confluent supratentorial white matter hypodensities. No acute large vascular territory infarcts. No abnormal extra-axial fluid collections. Basal cisterns are patent. VASCULAR: Moderate calcific atherosclerosis of the carotid siphons. SKULL: No skull fracture. Tiny residual LEFT frontal scalp hematoma. SINUSES/ORBITS: Moderate paranasal sinus mucosal thickening without air-fluid levels. Trace RIGHT mastoid effusion. Soft tissue within the external auditory canals most compatible with cerumen.The included ocular globes and orbital contents are non-suspicious. Status post LEFT ocular lens implants. OTHER: None. IMPRESSION: 1. No acute intracranial process. 2. Stable severe temporal lobe atrophy associated with neurodegenerative syndromes. No hydrocephalus. 3. Moderate to severe chronic small vessel ischemic disease. Electronically Signed   By: Awilda Metro M.D.   On: 05/26/2017 18:42   Dg Chest Portable 1 View  Result Date: 05/26/2017 CLINICAL DATA:  Altered mental status EXAM:  PORTABLE CHEST 1 VIEW COMPARISON:  03/28/2017 FINDINGS: Mildly low lung volumes. Linear scarring or atelectasis at the lung bases. No acute consolidation or effusion. Stable cardiomediastinal silhouette with aortic atherosclerosis. No pneumothorax. Advanced arthritis of the shoulders. IMPRESSION: No active disease.  Stable scarring at the lung bases Electronically Signed   By: Jasmine PangKim  Fujinaga M.D.   On: 05/26/2017 18:42    ____________________________________________   PROCEDURES  Procedure(s) performed:   Procedures  Critical Care performed:   ____________________________________________   INITIAL IMPRESSION / ASSESSMENT AND PLAN / ED  COURSE  patient's daughter is here. She does not want the patient stuck any more. We have been unable to get blood. Patient is doing well. Her blood pressure now is 153/68. The blood pressure cuff was off and wasnot recording properly since it was not on her arm for the last 2 low readings. I will give the patient some Rocephin IV and put her on Augmentin by mouth we will have her come back if she is worse.   daughter is aware that I cannot tell what's going on. She is aware that fever I discussed the blood pressure etc. with her we will try the antibiotics and close follow-up.patient is acting almost normal at the present time.      ____________________________________________   FINAL CLINICAL IMPRESSION(S) / ED DIAGNOSES  Final diagnoses:  Altered mental status, unspecified altered mental status type     ED Discharge Orders        Ordered    amoxicillin-clavulanate (AUGMENTIN) 875-125 MG tablet  2 times daily     05/26/17 2055       Note:  This document was prepared using Dragon voice recognition software and may include unintentional dictation errors.    Arnaldo NatalMalinda, Paul F, MD 05/26/17 2053    Arnaldo NatalMalinda, Paul F, MD 05/26/17 2056    Arnaldo NatalMalinda, Paul F, MD 06/08/17 [phone]

## 2017-05-26 NOTE — ED Notes (Signed)
IV team was unable to get blood from pt. Lab came down to draw pt and she jerked, was unable to get blood. Daughter does not want any more sticks on pt. Dr. Darnelle CatalanMalinda aware.

## 2017-05-26 NOTE — ED Notes (Signed)
Patient transported to X-ray 

## 2017-05-29 LAB — URINE CULTURE

## 2017-12-21 ENCOUNTER — Emergency Department: Payer: Medicare Other

## 2017-12-21 ENCOUNTER — Emergency Department
Admission: EM | Admit: 2017-12-21 | Discharge: 2017-12-21 | Disposition: A | Discharge status: Skilled Nursing Facility | Payer: Medicare Other | Attending: Emergency Medicine | Admitting: Emergency Medicine

## 2017-12-21 ENCOUNTER — Encounter: Payer: Self-pay | Admitting: Emergency Medicine

## 2017-12-21 ENCOUNTER — Other Ambulatory Visit: Payer: Self-pay

## 2017-12-21 DIAGNOSIS — S51012A Laceration without foreign body of left elbow, initial encounter: Secondary | ICD-10-CM | POA: Diagnosis not present

## 2017-12-21 DIAGNOSIS — F0390 Unspecified dementia without behavioral disturbance: Secondary | ICD-10-CM | POA: Diagnosis not present

## 2017-12-21 DIAGNOSIS — S0083XA Contusion of other part of head, initial encounter: Secondary | ICD-10-CM | POA: Insufficient documentation

## 2017-12-21 DIAGNOSIS — Y92122 Bedroom in nursing home as the place of occurrence of the external cause: Secondary | ICD-10-CM | POA: Insufficient documentation

## 2017-12-21 DIAGNOSIS — Y9384 Activity, sleeping: Secondary | ICD-10-CM | POA: Diagnosis not present

## 2017-12-21 DIAGNOSIS — S61411A Laceration without foreign body of right hand, initial encounter: Secondary | ICD-10-CM

## 2017-12-21 DIAGNOSIS — W06XXXA Fall from bed, initial encounter: Secondary | ICD-10-CM | POA: Diagnosis not present

## 2017-12-21 DIAGNOSIS — S6991XA Unspecified injury of right wrist, hand and finger(s), initial encounter: Secondary | ICD-10-CM | POA: Diagnosis present

## 2017-12-21 DIAGNOSIS — S61412A Laceration without foreign body of left hand, initial encounter: Secondary | ICD-10-CM | POA: Diagnosis not present

## 2017-12-21 DIAGNOSIS — Z79899 Other long term (current) drug therapy: Secondary | ICD-10-CM | POA: Insufficient documentation

## 2017-12-21 DIAGNOSIS — Y999 Unspecified external cause status: Secondary | ICD-10-CM | POA: Diagnosis not present

## 2017-12-21 DIAGNOSIS — E039 Hypothyroidism, unspecified: Secondary | ICD-10-CM | POA: Diagnosis not present

## 2017-12-21 MED ORDER — LIDOCAINE HCL (PF) 1 % IJ SOLN
INTRAMUSCULAR | Status: AC
  Administered 2017-12-21: 18:00:00
  Filled 2017-12-21: qty 5

## 2017-12-21 NOTE — ED Provider Notes (Signed)
[facility] Emergency Department Provider Note ____________________________________________   I have reviewed the triage vital signs and the triage nursing note.  HISTORY  Chief Complaint Fall   Historian Level 5 Caveat History Limited by patient poor historian Daughter provides history as well as EMS report from nursing home  HPI Amber Powers is a 84 y.o. female patient sent by EMS from nursing home after found in the floor last night and this morning noted to have skin tear to the left arm, bruising to the left knee and bruising to the left eyebrow.  Unwitnessed fall off of bed reported.  Daughter states she feels like her mother seems to be at baseline mental status and is interested in trauma evaluation, not having a suspicion for medical issue such as stroke or electrolyte disturbance or urinary tract infection after our discussion.  Patient says yes to headache.  She does not answer about neck pain.     Past Medical History:  Diagnosis Date  . Dementia (HCC)   . Hypothyroid     Patient Active Problem List   Diagnosis Date Noted  . Hypernatremia 03/08/2016  . Metabolic encephalopathy 03/08/2016  . Elevated transaminase level 03/08/2016  . Elevated troponin 03/08/2016  . Leukocytosis 03/08/2016  . Altered mental state 06/18/2014    History reviewed. No pertinent surgical history.  Prior to Admission medications   Medication Sig Start Date End Date Taking? Authorizing Provider  acetaminophen (TYLENOL) 500 MG tablet Take 500 mg by mouth every 4 (four) hours as needed for mild pain, moderate pain, fever or headache.    Yes [provider]  acetaminophen (TYLENOL) 500 MG tablet Take 500 mg by mouth 3 (three) times daily.   Yes [provider]  alum & mag hydroxide-simeth (MAALOX/MYLANTA) 200-200-20 MG/5ML suspension Take 30 mLs by mouth as needed for indigestion or heartburn.    Yes [provider]  busPIRone  (BUSPAR) 5 MG tablet Take 5 mg by mouth 2 (two) times daily.   Yes [provider]  guaiFENesin (ROBITUSSIN) 100 MG/5ML SOLN Take 10 mLs by mouth every 6 (six) hours as needed for cough.   Yes [provider]  ipratropium-albuterol (DUONEB) 0.5-2.5 (3) MG/3ML SOLN Take 3 mLs by nebulization every 4 (four) hours as needed (For wheezing). 03/28/17  Yes Governor Rooks, MD  levothyroxine (SYNTHROID, LEVOTHROID) 75 MCG tablet Take 75 mcg by mouth daily before breakfast.   Yes [provider]  loperamide (IMODIUM) 2 MG capsule Take 2 mg by mouth as needed for diarrhea or loose stools (Max 8 doses per 24 hours).   Yes [provider]  LORazepam (ATIVAN) 0.5 MG tablet Take 0.5 mg by mouth every 4 (four) hours as needed (for agitation).   Yes [provider]  magnesium hydroxide (MILK OF MAGNESIA) 400 MG/5ML suspension Take 30 mLs by mouth at bedtime as needed for mild constipation.   Yes [provider]  Neomycin-Bacitracin-Polymyxin (TRIPLE ANTIBIOTIC) [phone] OINT Apply topically 4 (four) times daily as needed (for wound care).    Yes [provider]  ondansetron (ZOFRAN) 4 MG tablet Take 1 tablet (4 mg total) by mouth every 8 (eight) hours as needed for nausea or vomiting. 02/26/16  Yes Jeanmarie Plant, MD  vitamin B-12 (CYANOCOBALAMIN) 1000 MCG tablet Take 1,000 mcg by mouth daily.   Yes [provider]  Zinc Oxide 13 % CREA Apply 1 application topically as directed. After each bathroom change   Yes [provider]  No Known Allergies  History reviewed. No pertinent family history.  Social History Social History   Tobacco Use  . Smoking status: Never Smoker  . Smokeless tobacco: Never Used  Substance Use Topics  . Alcohol use: No  . Drug use: No    Review of Systems Poor historian, difficult to get any questions answered  Does not answer yes to chest pain or trouble breathing or abdominal pain.  Per  daughter no history of recent illnesses such as vomiting or diarrhea.   ____________________________________________   PHYSICAL EXAM:  VITAL SIGNS: ED Triage Vitals  Enc Vitals Group     BP 12/21/17 1524 (!) 141/84     Pulse Rate 12/21/17 1524 62     Resp 12/21/17 1524 18     Temp 12/21/17 1524 97.9 F (36.6 C)     Temp Source 12/21/17 1524 Oral     SpO2 12/21/17 1524 100 %     Weight 12/21/17 1526 130 lb 1.1 oz (59 kg)     Height 12/21/17 1526 5\' 4"  (1.626 m)     Head Circumference --      Peak Flow --      Pain Score --      Pain Loc --      Pain Edu? --      Excl. in GC? --      Constitutional: Alert to voice, poor historian and does not exactly follow commands. HEENT      Head: Normocephalic.  Left eyebrow bruised.      Eyes: Conjunctivae are normal. Pupils equal and round.  No extraocular movement deficit.      Ears:         Nose: No congestion/rhinnorhea.      Mouth/Throat: Mucous membranes are moist.      Neck: No stridor.  Kyphotic.  No step-off. Cardiovascular/Chest: Normal rate, regular rhythm.  No murmurs, rubs, or gallops. Respiratory: Normal respiratory effort without tachypnea nor retractions. Breath sounds are clear and equal bilaterally. No wheezes/rales/rhonchi. Gastrointestinal: Soft. No distention, no guarding, no rebound. Nontender.    Genitourinary/rectal:Deferred Musculoskeletal: Nontender with normal range of motion in all extremities. No joint effusions.  No lower extremity tenderness.  No edema.   Neurologic: Does move 4 extremities.  No facial droop.  Just answers a couple of one-word questions. Skin:  Skin is warm, dry.  Skin tear right hand dorsally.  Skin tear left hand dorsally.  4 cm laceration distal to left elbow.  No bony tenderness. Psychiatric: No agitation.   ____________________________________________  LABS (pertinent positives/negatives) I, Governor Rooks, MD the attending physician have reviewed the labs noted below.  Labs  Reviewed - No data to display  ____________________________________________    EKG I, Governor Rooks, MD, the attending physician have personally viewed and interpreted all ECGs.  None ____________________________________________  RADIOLOGY   CT head and cervical spine without contrast, radiologist report reviewed:  IMPRESSION: 1. No acute intracranial abnormality. Stable severe temporal lobe atrophy and chronic microvascular ischemic changes. 2. No acute cervical spine fracture. Stable cervical spondylosis. __________________________________________  PROCEDURES  Procedure(s) performed: None  .Marland KitchenLaceration Repair Date/Time: 12/21/2017 6:08 PM Performed by: Governor Rooks, MD Authorized by: Governor Rooks, MD   Consent:    Consent obtained:  Verbal   Consent given by:  Healthcare agent   Risks discussed:  Pain   Alternatives discussed:  Delayed treatment Anesthesia (see MAR for exact dosages):    Anesthesia method:  Local infiltration   Local anesthetic:  Lidocaine 1%  w/o epi Laceration details:    Location:  Shoulder/arm   Shoulder/arm location:  L elbow   Length (cm):  4 Repair type:    Repair type:  Simple Pre-procedure details:    Preparation:  Patient was prepped and draped in usual sterile fashion Exploration:    Wound exploration: wound explored through full range of motion     Contaminated: no   Treatment:    Area cleansed with:  Betadine   Amount of cleaning:  Standard   Irrigation solution:  Sterile saline   Visualized foreign bodies/material removed: no   Skin repair:    Repair method:  Sutures   Suture size:  4-0   Suture material:  Prolene   Suture technique:  Simple interrupted   Number of sutures:  3 Approximation:    Approximation:  Close Post-procedure details:    Patient tolerance of procedure:  Tolerated well, no immediate complications  Left hand with Steri-Strips.  Approximate 4 cm Right hand with nonadherent dressing to skin tear,  approximately 4 cm  Critical Care performed: None   ____________________________________________  ED COURSE / ASSESSMENT AND PLAN  Pertinent labs & imaging results that were available during my care of the patient were reviewed by me and considered in my medical decision making (see chart for details).     Discussed with daughter evaluating for head and C-spine trauma given unwitnessed fall and chose to proceed.  No intracranial or cervical spine injury found on CT imaging.  She does have some mild bruising to her left knee, but she does not appear to be tender all there, able to extend the knee without pain.   Update on tetanus.   CONSULTATIONS:   None   Patient / Family / Caregiver informed of clinical course, medical decision-making process, and agree with plan.   I discussed return precautions, follow-up instructions, and discharge instructions with patient and/or family.  Discharge Instructions : Please replace nonadherent dressing to both hands and elbow once daily and assess for any redness or drainage.  Return to emerge department immediately for any new or worsening condition including redness or manage or skin rash from wound areas.    ___________________________________________   FINAL CLINICAL IMPRESSION(S) / ED DIAGNOSES   Final diagnoses:  Skin tear of right hand without complication, initial encounter  Skin tear of left hand without complication, initial encounter  Elbow laceration, left, initial encounter  Contusion of face, initial encounter      ___________________________________________         Note: This dictation was prepared with Dragon dictation. Any transcriptional errors that result from this process are unintentional    Governor Rooks, MD 12/21/17 [phone]

## 2017-12-21 NOTE — ED Triage Notes (Signed)
Pt from [facility] via [facility], with complaints of a fall. Staff reported to EMS that she rocks herself to sleep and she she "rocked herself out of the bed" "We found her on the floor". They also told EMS that the daughter does not want a MRI or blood work. Pt has dementia

## 2017-12-21 NOTE — ED Notes (Signed)
Report given to angela at [facility]. Awaiting transport at this time.

## 2017-12-21 NOTE — Discharge Instructions (Addendum)
Please replace nonadherent dressing to both hands and elbow once daily and assess for any redness or drainage.  Return to emergency department immediately for any new or worsening condition including redness or manage or skin rash from wound areas.

## 2018-02-16 IMAGING — CT CT CERVICAL SPINE W/O CM
3 series · 12 of 35 positions shown, 14 images · non-contrast
Comparison: Head CT same day neck CT 09/30/2015 [DATE]

CLINICAL DATA: Fall.  Neck pain

EXAM:
CT CERVICAL SPINE WITHOUT CONTRAST
TECHNIQUE: Multidetector CT imaging of the cervical spine was performed without
intravenous contrast. Multiplanar CT image reconstructions were also
generated.

[Series 3: c spine soft · axial · 0.36mm/px · z∈[-258,-166]mm · 4 of 68 slices shown, 5 images]
[im 11/68  soft-tissue]
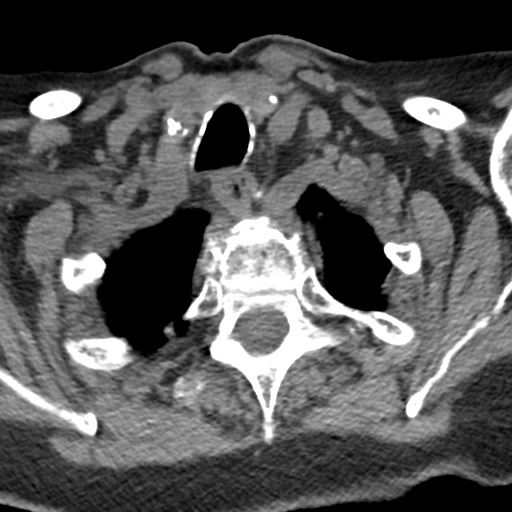
[im 11/68  bone]
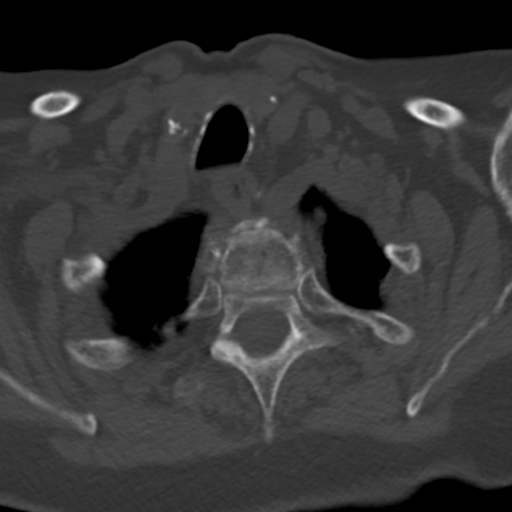
[im 26/68  bone]
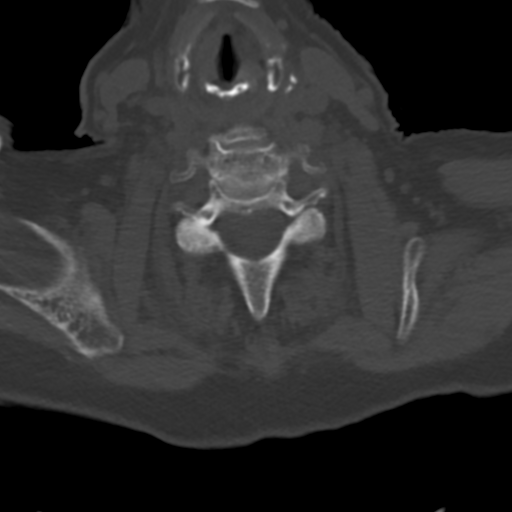
[im 42/68  bone]
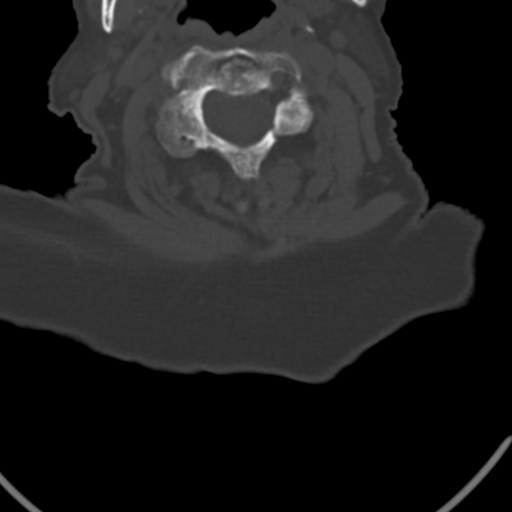
[im 57/68  bone]
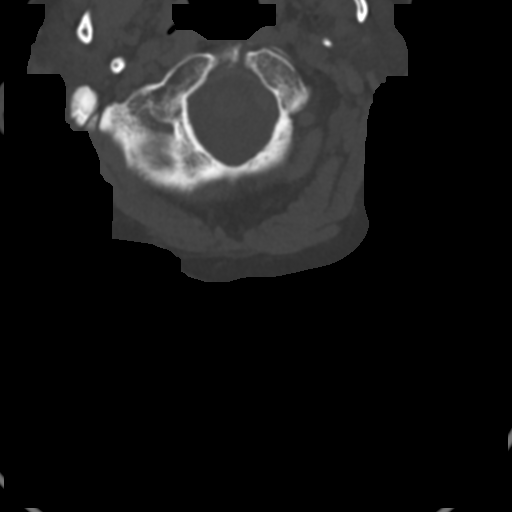

[Series 6: sagittal bone · sagittal · 0.26mm/px · 5 of 58 slices shown, 6 images]
[im 20/58  bone]
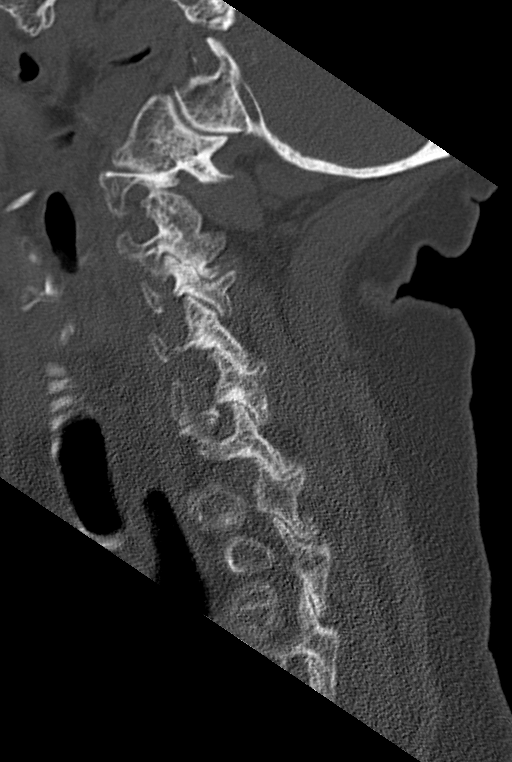
[im 24/58  bone]
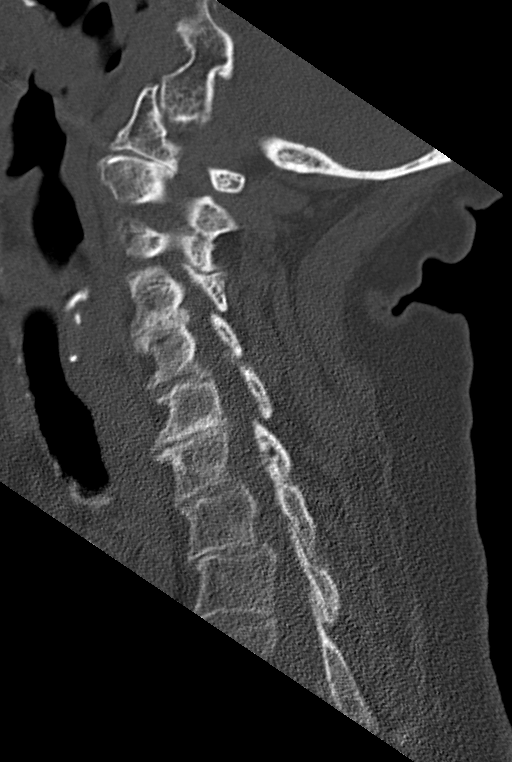
[im 29/58  soft-tissue]
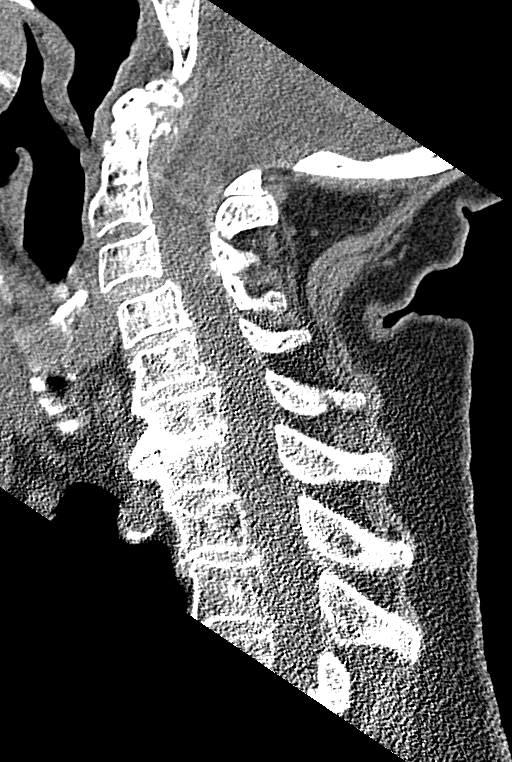
[im 29/58  bone]
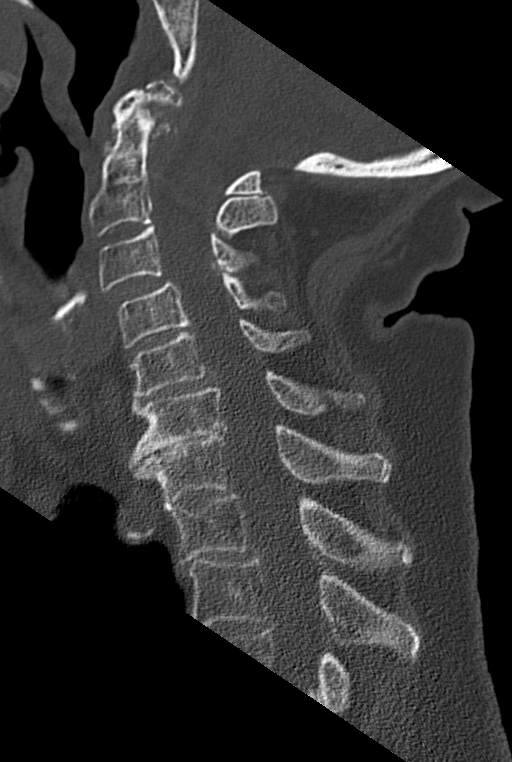
[im 34/58  bone]
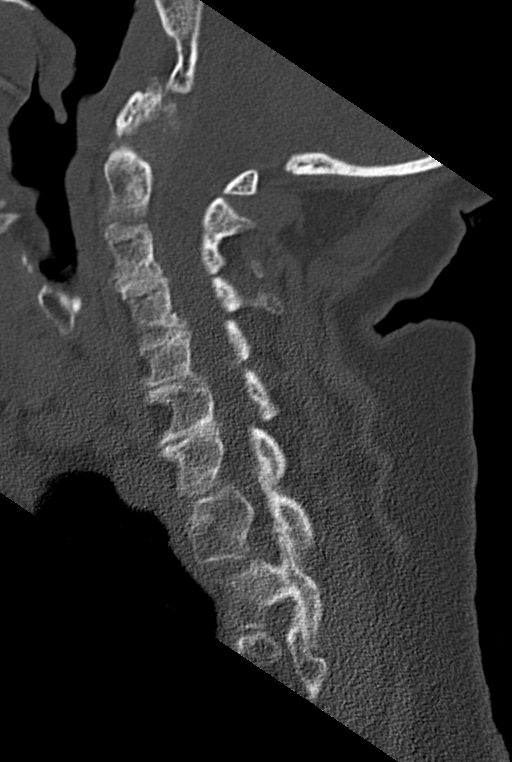
[im 39/58  bone]
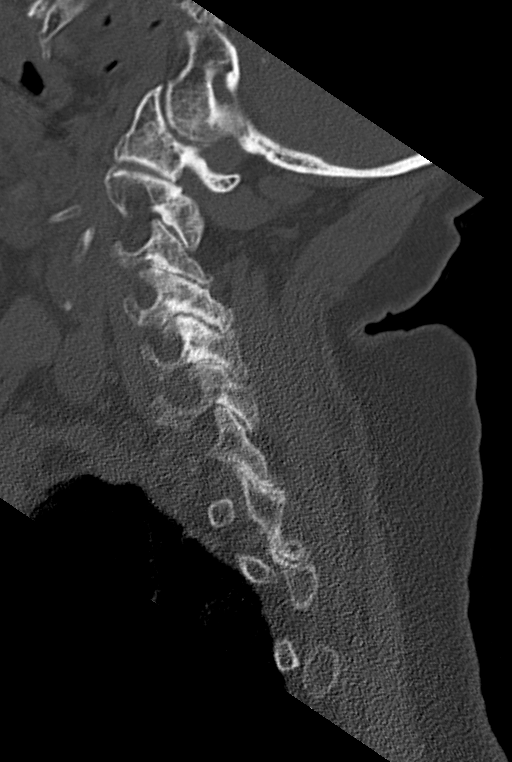

[Series 7: coronal bone · coronal · 0.26mm/px · 3 of 76 slices shown]
[im 24/76  bone]
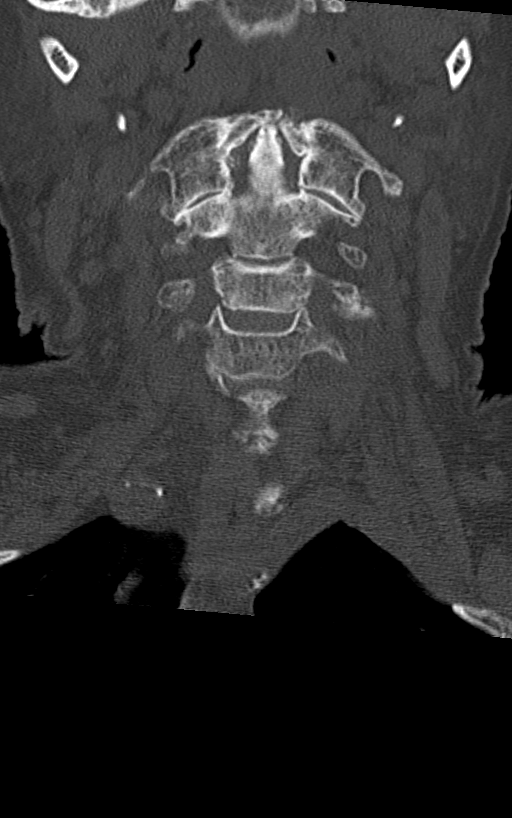
[im 33/76  bone]
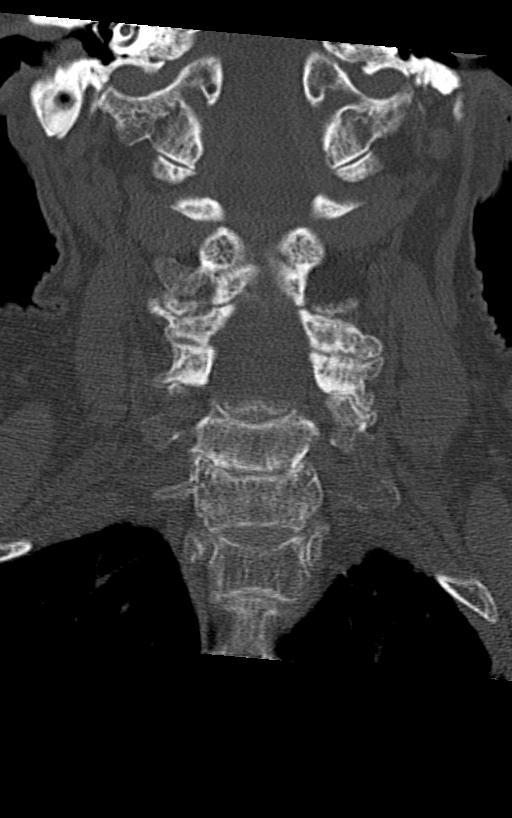
[im 43/76  bone]
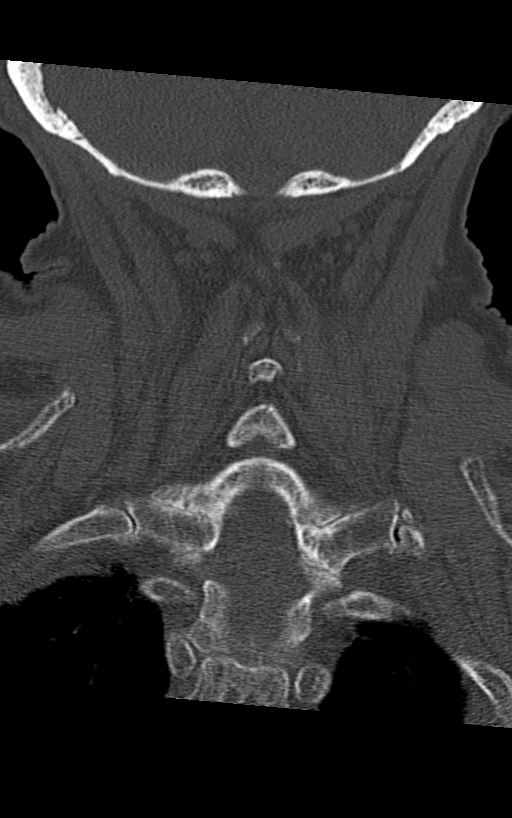

[12 of 35 positions shown; findings below may reference images not displayed]

FINDINGS: Alignment: Normal alignment of the cervical vertebral bodies.

Skull base and vertebrae: Normal craniocervical junction. No loss of
vertebral body height or disc height. Normal facet articulation. No
evidence of fracture.

Soft tissues and spinal canal: No prevertebral soft tissue swelling.
No perispinal or epidural hematoma.

Disc levels: Disc space narrowing at C6-C7 with endplate
osteophytosis. No change from prior. Normal facet articulation. No
subluxation.

Upper chest: Clear

Other: None
IMPRESSION: 1. No cervical spine fracture.
2. Disc osteophytic disease lower cervical spine not changed from
prior.
3. Multilevel facet hypertrophy

## 2019-01-18 DEATH — deceased
